# Patient Record
Sex: Female | Born: 1960 | Race: White | Hispanic: No | Smoking: Never smoker
Health system: Southern US, Community
[De-identification: ages and names within clinical notes are randomized; demographics above are authoritative.]

## PROBLEM LIST (undated history)

## (undated) DIAGNOSIS — R51 Headache: Secondary | ICD-10-CM

## (undated) DIAGNOSIS — IMO0002 Reserved for concepts with insufficient information to code with codable children: Secondary | ICD-10-CM

## (undated) DIAGNOSIS — F32A Depression, unspecified: Secondary | ICD-10-CM

## (undated) DIAGNOSIS — Z9889 Other specified postprocedural states: Secondary | ICD-10-CM

## (undated) DIAGNOSIS — R87619 Unspecified abnormal cytological findings in specimens from cervix uteri: Secondary | ICD-10-CM

## (undated) DIAGNOSIS — R112 Nausea with vomiting, unspecified: Secondary | ICD-10-CM

## (undated) DIAGNOSIS — F329 Major depressive disorder, single episode, unspecified: Secondary | ICD-10-CM

## (undated) HISTORY — PX: GYNECOLOGIC CRYOSURGERY: SHX857

## (undated) HISTORY — DX: Reserved for concepts with insufficient information to code with codable children: IMO0002

## (undated) HISTORY — PX: TUBAL LIGATION: SHX77

## (undated) HISTORY — DX: Unspecified abnormal cytological findings in specimens from cervix uteri: R87.619

## (undated) HISTORY — PX: LEEP: SHX91

## (undated) HISTORY — DX: Headache: R51

## (undated) HISTORY — PX: WISDOM TOOTH EXTRACTION: SHX21

---

## 1998-03-06 ENCOUNTER — Ambulatory Visit (HOSPITAL_COMMUNITY): Admission: RE | Admit: 1998-03-06 | Discharge: 1998-03-06 | Payer: Self-pay | Admitting: Family Medicine

## 1999-07-15 ENCOUNTER — Encounter: Admission: RE | Admit: 1999-07-15 | Discharge: 1999-10-13 | Payer: Self-pay | Admitting: Obstetrics & Gynecology

## 2000-04-23 ENCOUNTER — Ambulatory Visit (HOSPITAL_COMMUNITY): Admission: RE | Admit: 2000-04-23 | Discharge: 2000-04-23 | Payer: Self-pay | Admitting: *Deleted

## 2000-04-23 ENCOUNTER — Encounter: Payer: Self-pay | Admitting: *Deleted

## 2000-12-29 ENCOUNTER — Ambulatory Visit (HOSPITAL_BASED_OUTPATIENT_CLINIC_OR_DEPARTMENT_OTHER): Admission: RE | Admit: 2000-12-29 | Discharge: 2000-12-29 | Payer: Self-pay | Admitting: Orthopedic Surgery

## 2001-11-09 HISTORY — PX: CARPAL TUNNEL RELEASE: SHX101

## 2001-12-14 ENCOUNTER — Other Ambulatory Visit: Admission: RE | Admit: 2001-12-14 | Discharge: 2001-12-14 | Payer: Self-pay | Admitting: Obstetrics and Gynecology

## 2001-12-23 ENCOUNTER — Ambulatory Visit (HOSPITAL_COMMUNITY): Admission: RE | Admit: 2001-12-23 | Discharge: 2001-12-23 | Payer: Self-pay | Admitting: Obstetrics and Gynecology

## 2001-12-23 ENCOUNTER — Encounter: Payer: Self-pay | Admitting: Obstetrics and Gynecology

## 2003-01-08 ENCOUNTER — Other Ambulatory Visit: Admission: RE | Admit: 2003-01-08 | Discharge: 2003-01-08 | Payer: Self-pay | Admitting: Obstetrics and Gynecology

## 2003-01-10 ENCOUNTER — Encounter: Payer: Self-pay | Admitting: Obstetrics and Gynecology

## 2003-01-10 ENCOUNTER — Ambulatory Visit (HOSPITAL_COMMUNITY): Admission: RE | Admit: 2003-01-10 | Discharge: 2003-01-10 | Payer: Self-pay | Admitting: Obstetrics and Gynecology

## 2003-04-09 ENCOUNTER — Inpatient Hospital Stay (HOSPITAL_COMMUNITY): Admission: EM | Admit: 2003-04-09 | Discharge: 2003-04-11 | Payer: Self-pay | Admitting: Emergency Medicine

## 2003-04-09 ENCOUNTER — Encounter: Payer: Self-pay | Admitting: Emergency Medicine

## 2003-07-15 ENCOUNTER — Encounter: Payer: Self-pay | Admitting: Emergency Medicine

## 2003-07-15 ENCOUNTER — Emergency Department (HOSPITAL_COMMUNITY): Admission: AD | Admit: 2003-07-15 | Discharge: 2003-07-15 | Payer: Self-pay | Admitting: Emergency Medicine

## 2004-01-30 ENCOUNTER — Other Ambulatory Visit: Admission: RE | Admit: 2004-01-30 | Discharge: 2004-01-30 | Payer: Self-pay | Admitting: Obstetrics and Gynecology

## 2004-04-17 ENCOUNTER — Emergency Department (HOSPITAL_COMMUNITY): Admission: EM | Admit: 2004-04-17 | Discharge: 2004-04-17 | Payer: Self-pay | Admitting: Emergency Medicine

## 2004-09-18 ENCOUNTER — Ambulatory Visit (HOSPITAL_COMMUNITY): Admission: RE | Admit: 2004-09-18 | Discharge: 2004-09-18 | Payer: Self-pay | Admitting: Obstetrics and Gynecology

## 2004-12-01 ENCOUNTER — Ambulatory Visit: Payer: Self-pay | Admitting: Psychology

## 2004-12-08 ENCOUNTER — Ambulatory Visit: Payer: Self-pay | Admitting: Psychology

## 2004-12-15 ENCOUNTER — Ambulatory Visit: Payer: Self-pay | Admitting: Psychology

## 2004-12-31 ENCOUNTER — Ambulatory Visit: Payer: Self-pay | Admitting: Psychology

## 2005-03-16 ENCOUNTER — Ambulatory Visit: Payer: Self-pay | Admitting: Psychology

## 2005-03-24 ENCOUNTER — Ambulatory Visit: Payer: Self-pay | Admitting: Psychology

## 2005-04-30 ENCOUNTER — Ambulatory Visit: Payer: Self-pay | Admitting: Psychology

## 2005-05-26 ENCOUNTER — Ambulatory Visit: Payer: Self-pay | Admitting: Psychology

## 2005-07-08 ENCOUNTER — Ambulatory Visit: Payer: Self-pay | Admitting: Psychology

## 2005-07-17 ENCOUNTER — Ambulatory Visit: Payer: Self-pay | Admitting: Psychology

## 2005-08-28 ENCOUNTER — Ambulatory Visit: Payer: Self-pay | Admitting: Psychology

## 2006-03-05 ENCOUNTER — Ambulatory Visit (HOSPITAL_COMMUNITY): Payer: Self-pay | Admitting: Psychiatry

## 2006-03-24 ENCOUNTER — Ambulatory Visit (HOSPITAL_COMMUNITY): Payer: Self-pay | Admitting: Psychiatry

## 2006-05-19 ENCOUNTER — Ambulatory Visit (HOSPITAL_COMMUNITY): Payer: Self-pay | Admitting: Psychiatry

## 2006-06-09 ENCOUNTER — Other Ambulatory Visit: Admission: RE | Admit: 2006-06-09 | Discharge: 2006-06-09 | Payer: Self-pay | Admitting: Obstetrics and Gynecology

## 2006-06-11 ENCOUNTER — Ambulatory Visit (HOSPITAL_COMMUNITY): Admission: RE | Admit: 2006-06-11 | Discharge: 2006-06-11 | Payer: Self-pay | Admitting: Obstetrics and Gynecology

## 2006-07-19 ENCOUNTER — Ambulatory Visit (HOSPITAL_COMMUNITY): Payer: Self-pay | Admitting: Psychiatry

## 2006-10-18 ENCOUNTER — Ambulatory Visit (HOSPITAL_COMMUNITY): Payer: Self-pay | Admitting: Psychiatry

## 2007-01-17 ENCOUNTER — Ambulatory Visit (HOSPITAL_COMMUNITY): Payer: Self-pay | Admitting: Psychiatry

## 2007-02-08 ENCOUNTER — Encounter: Admission: RE | Admit: 2007-02-08 | Discharge: 2007-02-08 | Payer: Self-pay | Admitting: Obstetrics and Gynecology

## 2007-03-25 ENCOUNTER — Ambulatory Visit (HOSPITAL_COMMUNITY): Payer: Self-pay | Admitting: Psychiatry

## 2007-05-04 ENCOUNTER — Ambulatory Visit (HOSPITAL_COMMUNITY): Payer: Self-pay | Admitting: Psychiatry

## 2007-07-04 ENCOUNTER — Ambulatory Visit (HOSPITAL_COMMUNITY): Payer: Self-pay | Admitting: Psychiatry

## 2007-07-27 ENCOUNTER — Ambulatory Visit (HOSPITAL_COMMUNITY): Admission: RE | Admit: 2007-07-27 | Discharge: 2007-07-27 | Payer: Self-pay | Admitting: Obstetrics and Gynecology

## 2007-08-10 ENCOUNTER — Ambulatory Visit (HOSPITAL_COMMUNITY): Admission: RE | Admit: 2007-08-10 | Discharge: 2007-08-10 | Payer: Self-pay | Admitting: Obstetrics and Gynecology

## 2007-09-15 ENCOUNTER — Emergency Department (HOSPITAL_COMMUNITY): Admission: EM | Admit: 2007-09-15 | Discharge: 2007-09-15 | Payer: Self-pay | Admitting: Emergency Medicine

## 2007-09-28 ENCOUNTER — Ambulatory Visit (HOSPITAL_COMMUNITY): Payer: Self-pay | Admitting: Psychiatry

## 2007-10-26 ENCOUNTER — Ambulatory Visit (HOSPITAL_COMMUNITY): Payer: Self-pay | Admitting: Psychiatry

## 2007-12-07 ENCOUNTER — Ambulatory Visit (HOSPITAL_COMMUNITY): Payer: Self-pay | Admitting: Psychiatry

## 2008-01-25 ENCOUNTER — Ambulatory Visit (HOSPITAL_COMMUNITY): Payer: Self-pay | Admitting: Psychiatry

## 2008-04-30 IMAGING — US US ABDOMEN COMPLETE
1 series · 14 of 25 positions shown · non-contrast
Comparison: none

CLINICAL DATA: Right upper quadrant pain.  
 ABDOMEN ULTRASOUND:
TECHNIQUE: Complete abdominal ultrasound examination was performed including evaluation of the liver, gallbladder, bile ducts, pancreas, kidneys, spleen, IVC, and abdominal aorta.

[Series 1: us abdomen complete · 0.28mm/px · 14 of 72 slices shown]
[im 1/72]
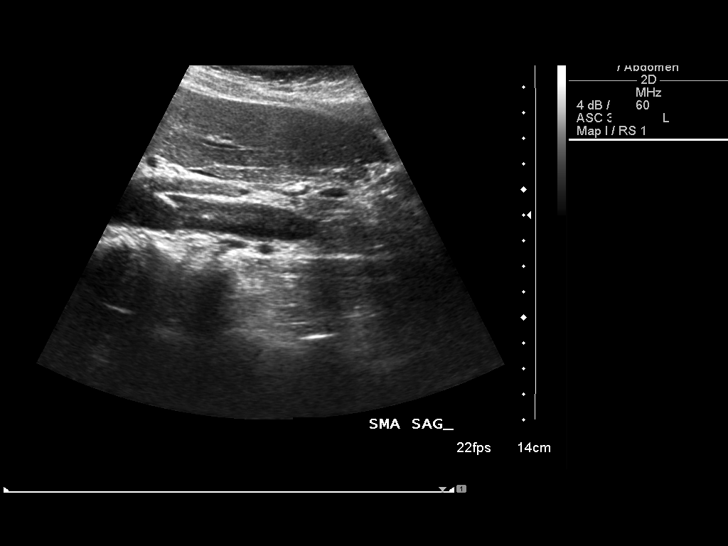
[im 6/72]
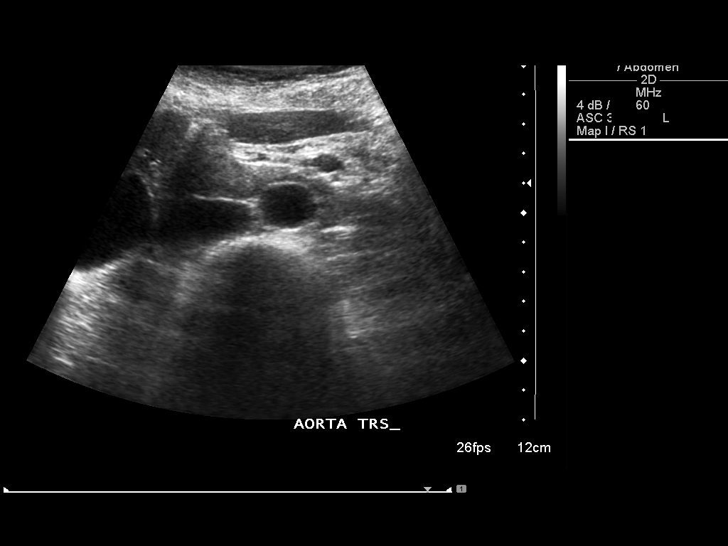
[im 12/72]
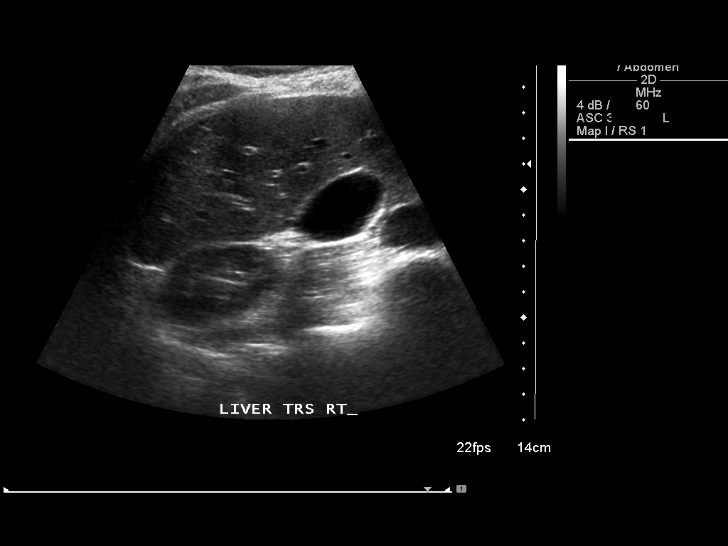
[im 18/72]
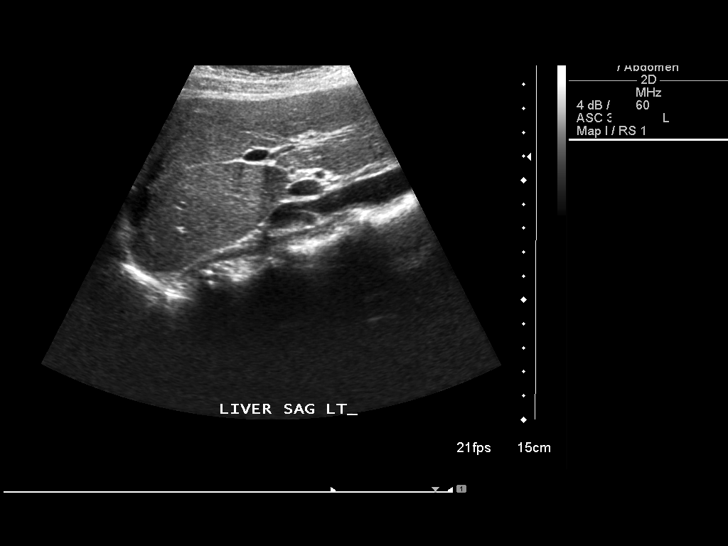
[im 24/72]
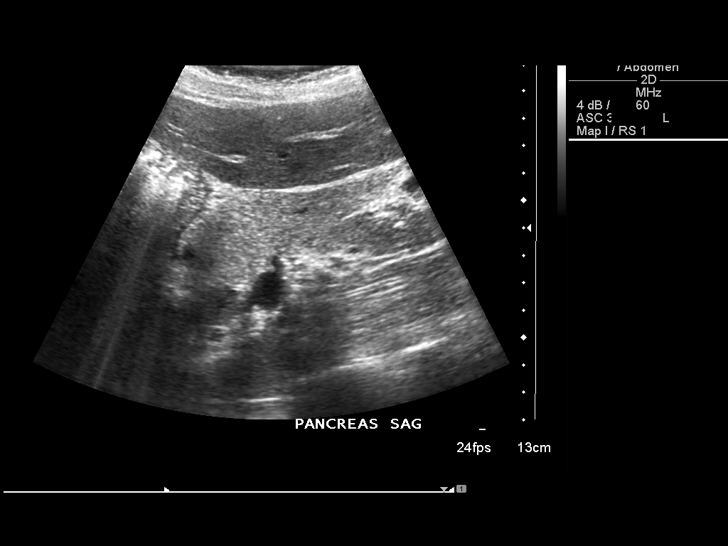
[im 27/72]
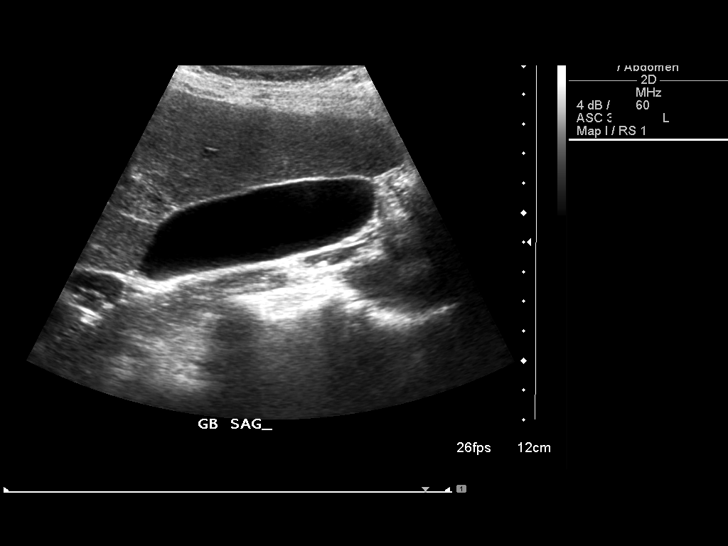
[im 33/72]
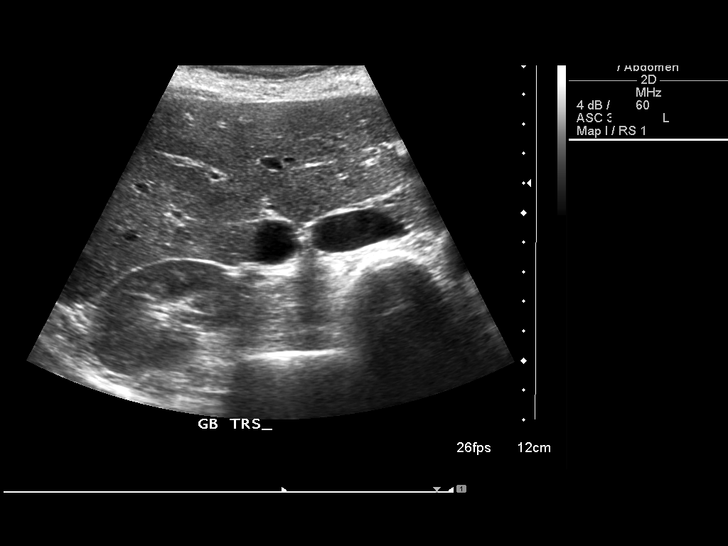
[im 39/72]
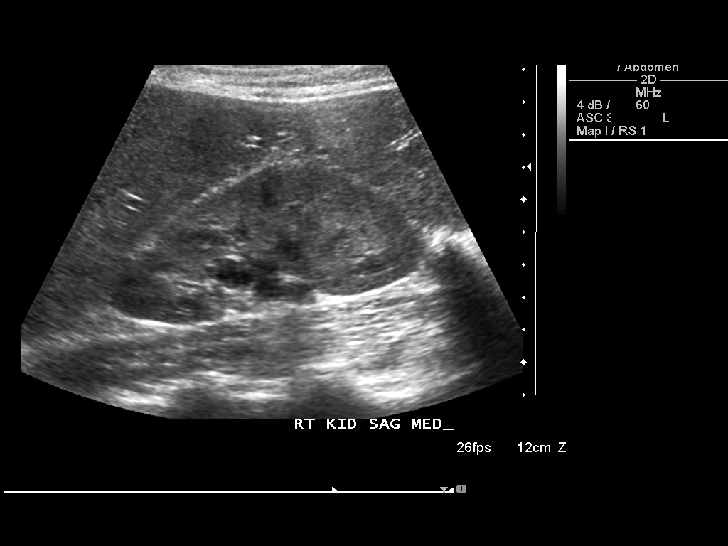
[im 45/72]
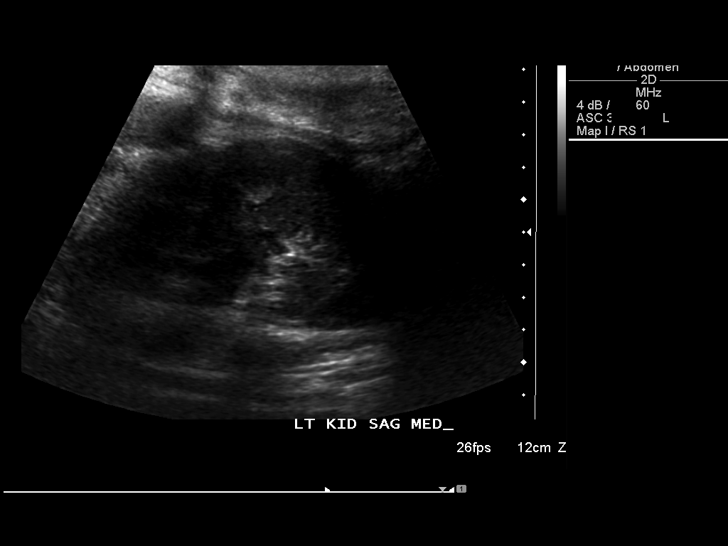
[im 48/72]
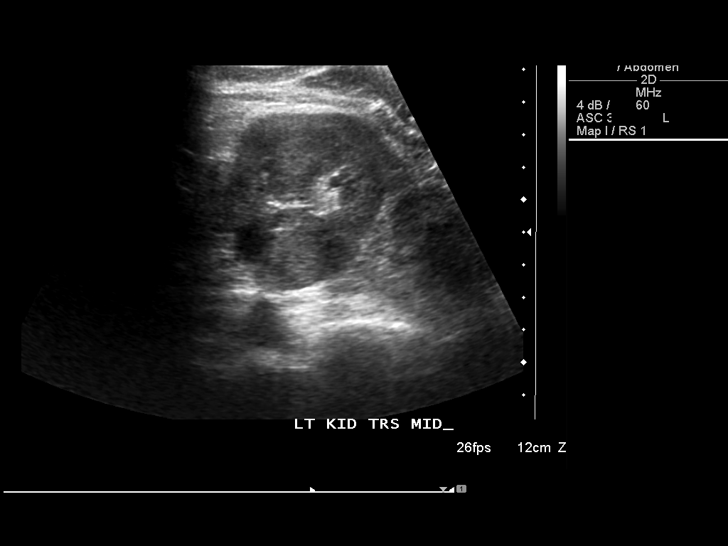
[im 54/72]
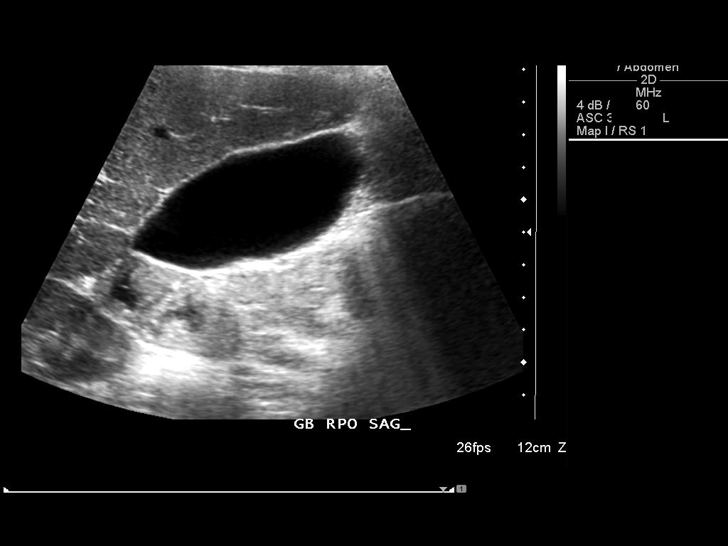
[im 60/72]
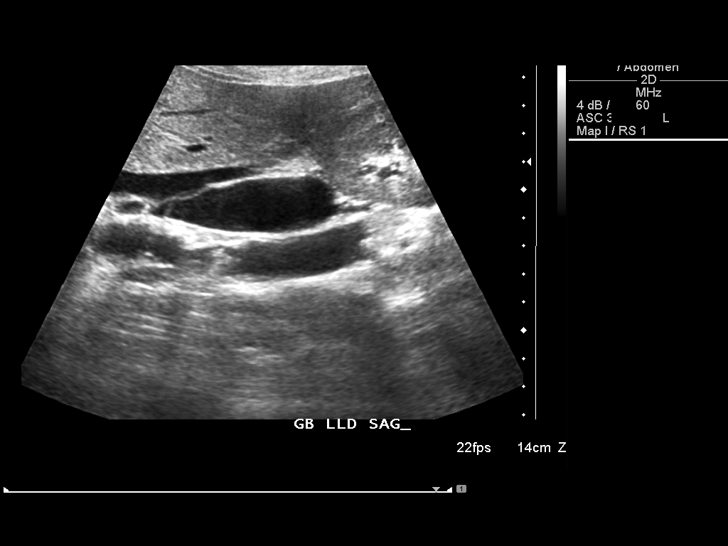
[im 66/72]
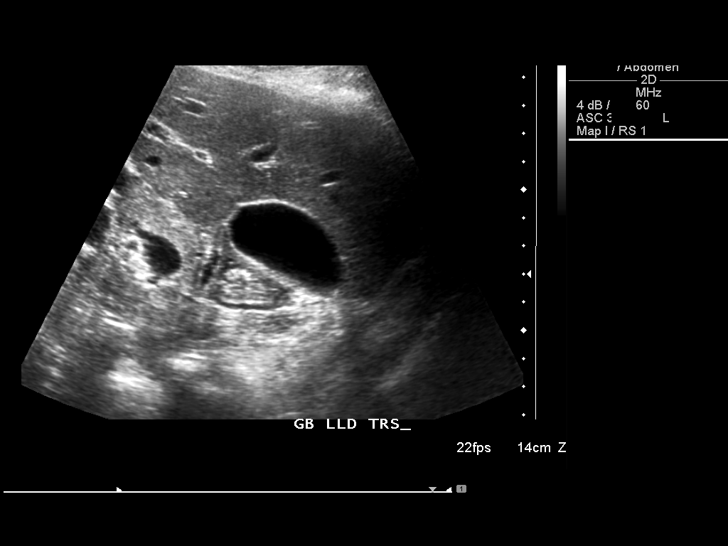
[im 72/72]
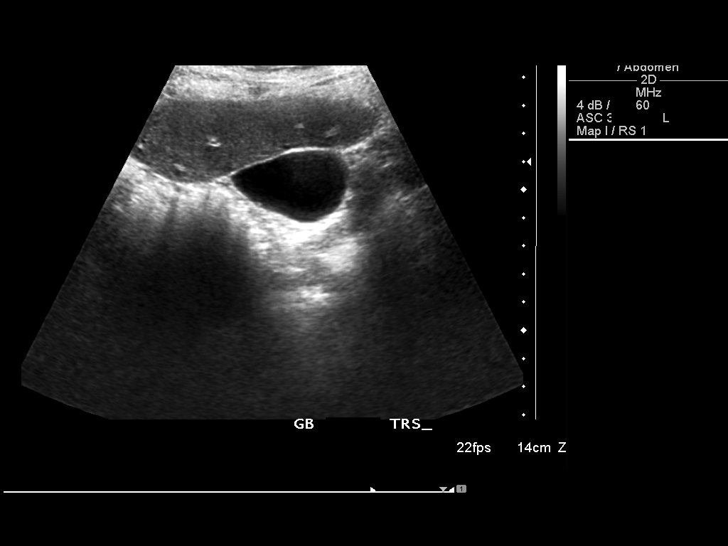

[14 of 25 positions shown; findings below may reference images not displayed]

FINDINGS: The gallbladder is well visualized and appears normal.  No dilated bile ducts.  The common bile duct is 2.2 mm in diameter.  The liver parenchyma, pancreas, inferior vena cava, spleen, kidneys and abdominal aorta are normal.  The right kidney is 11.3 cm in length, and the left kidney is 11.1 cm in length.  Maximum diameter of the abdominal aorta is 1.5 cm.  Spleen is 8.3 cm in maximum length.
IMPRESSION: Normal abdominal ultrasound.  Specifically, the gallbladder is normal.

## 2008-06-06 ENCOUNTER — Ambulatory Visit (HOSPITAL_COMMUNITY): Payer: Self-pay | Admitting: Psychiatry

## 2008-08-28 ENCOUNTER — Encounter: Admission: RE | Admit: 2008-08-28 | Discharge: 2008-08-28 | Payer: Self-pay | Admitting: Internal Medicine

## 2008-11-21 ENCOUNTER — Ambulatory Visit (HOSPITAL_COMMUNITY): Payer: Self-pay | Admitting: Psychiatry

## 2009-03-13 ENCOUNTER — Ambulatory Visit (HOSPITAL_COMMUNITY): Payer: Self-pay | Admitting: Psychiatry

## 2009-07-03 ENCOUNTER — Ambulatory Visit (HOSPITAL_COMMUNITY): Payer: Self-pay | Admitting: Psychiatry

## 2009-08-30 ENCOUNTER — Ambulatory Visit (HOSPITAL_COMMUNITY): Admission: RE | Admit: 2009-08-30 | Discharge: 2009-08-30 | Payer: Self-pay | Admitting: Internal Medicine

## 2010-01-06 ENCOUNTER — Ambulatory Visit (HOSPITAL_COMMUNITY): Payer: Self-pay | Admitting: Psychiatry

## 2010-05-05 ENCOUNTER — Ambulatory Visit (HOSPITAL_COMMUNITY): Payer: Self-pay | Admitting: Psychiatry

## 2010-06-23 ENCOUNTER — Ambulatory Visit (HOSPITAL_COMMUNITY): Admission: RE | Admit: 2010-06-23 | Discharge: 2010-06-23 | Payer: Self-pay | Admitting: Obstetrics and Gynecology

## 2010-06-23 ENCOUNTER — Ambulatory Visit (HOSPITAL_COMMUNITY): Payer: Self-pay | Admitting: Licensed Clinical Social Worker

## 2010-07-16 ENCOUNTER — Ambulatory Visit (HOSPITAL_COMMUNITY): Payer: Self-pay | Admitting: Psychiatry

## 2010-08-15 ENCOUNTER — Ambulatory Visit (HOSPITAL_COMMUNITY): Payer: Self-pay | Admitting: Licensed Clinical Social Worker

## 2010-08-27 ENCOUNTER — Ambulatory Visit (HOSPITAL_COMMUNITY): Payer: Self-pay | Admitting: Psychiatry

## 2010-09-15 ENCOUNTER — Ambulatory Visit (HOSPITAL_COMMUNITY): Payer: Self-pay | Admitting: Licensed Clinical Social Worker

## 2010-10-20 ENCOUNTER — Ambulatory Visit (HOSPITAL_COMMUNITY): Payer: Self-pay | Admitting: Psychiatry

## 2010-10-27 ENCOUNTER — Ambulatory Visit (HOSPITAL_COMMUNITY): Payer: Self-pay | Admitting: Licensed Clinical Social Worker

## 2010-11-19 ENCOUNTER — Ambulatory Visit (HOSPITAL_COMMUNITY)
Admission: RE | Admit: 2010-11-19 | Discharge: 2010-11-19 | Payer: Self-pay | Source: Home / Self Care | Attending: Psychiatry | Admitting: Psychiatry

## 2010-11-30 ENCOUNTER — Encounter: Payer: Self-pay | Admitting: Internal Medicine

## 2010-12-17 ENCOUNTER — Encounter (HOSPITAL_COMMUNITY): Payer: BC Managed Care – PPO | Admitting: Psychiatry

## 2010-12-17 ENCOUNTER — Encounter (HOSPITAL_COMMUNITY): Payer: Self-pay

## 2010-12-17 DIAGNOSIS — F39 Unspecified mood [affective] disorder: Secondary | ICD-10-CM

## 2011-01-14 ENCOUNTER — Encounter (HOSPITAL_COMMUNITY): Payer: BC Managed Care – PPO | Admitting: Psychiatry

## 2011-01-14 DIAGNOSIS — F39 Unspecified mood [affective] disorder: Secondary | ICD-10-CM

## 2011-02-25 ENCOUNTER — Encounter (HOSPITAL_COMMUNITY): Payer: BC Managed Care – PPO | Admitting: Psychiatry

## 2011-02-25 DIAGNOSIS — F39 Unspecified mood [affective] disorder: Secondary | ICD-10-CM

## 2011-03-27 NOTE — Op Note (Signed)
. [facility]  Patient:    Courtney Lee, Courtney Lee                    MRN: [id number] Proc. Date: 12/29/00 Adm. Date:  [id number] Attending:  Alinda Deem                           Operative Report  PREOPERATIVE DIAGNOSIS:  Right carpal tunnel syndrome.  POSTOPERATIVE DIAGNOSIS:  Right carpal tunnel syndrome.  PROCEDURE:  Right carpal tunnel release.  SURGEON:  Alinda Deem, M.D.  FIRST ASSISTANT:  Dorthula Matas, P.A.-C.  ANESTHESIA:  IV regional.  ESTIMATED BLOOD LOSS:  Minimal.  FLUID REPLACEMENT:  500 cc crystalloid.  TOURNIQUET TIME:  25 minutes.  INDICATIONS FOR PROCEDURE:  A 50 year old woman with classic right carpal tunnel syndrome, who desires elective release, having failed conservative treatment with anti-inflammatory medicines, splinting, and I believe she only got temporary relief from a cortisone injection.  Pain wakes her up five of seven nights per week, and she has put up with this for many months and now desires carpal tunnel release.  DESCRIPTION OF PROCEDURE:  Patient identified by arm band, taken to the operating room at [facility], appropriate anesthetic monitors were attached, and IV regional anesthesia was induced into the right upper extremity.  The right upper extremity was then prepped and draped in the usual sterile fashion from the fingertips to the midforearm tourniquet.  I began the procedure by making a volar longitudinal midline incision over the carpal tunnel and just to the ulnar side of the thenar crease.  It was about 4 cm in length.  Small bleeders in the skin and subcutaneous tissue identified and cauterized with bipolar.  We then cut down on the palmar fascia distally and identified the carpal tunnel.  Freer elevator was then passed into the tunnel, keeping it volar up against the ligament, and we then cut down on the [employer] with a 15 blade, performing the carpal  tunnel release.  This release was then extended up into the forearm using the tenotomy scissors that were blunt to further decompress the nerve.  The nerve was identified intact with some blanching, consistent with the carpal tunnel.  The carpal tunnel was then explored with a small finger, and no ganglia or masses were encountered.  The wound was washed out with normal saline solution, then the skin only was closed with running 5-0 nylon suture.  A dressing of Xeroform, 4 x 8 dressing sponges, Webril, and Ace wrap and a _____ splint was applied.  The patient was then taken to the recovery room without difficulty after first letting the tourniquet down. DD:  12/29/00 TD:  12/29/00 Job: [id number]

## 2011-04-22 ENCOUNTER — Encounter (HOSPITAL_COMMUNITY): Payer: BC Managed Care – PPO | Admitting: Psychiatry

## 2011-04-22 DIAGNOSIS — F39 Unspecified mood [affective] disorder: Secondary | ICD-10-CM

## 2011-06-11 ENCOUNTER — Other Ambulatory Visit (HOSPITAL_COMMUNITY): Payer: Self-pay | Admitting: Obstetrics and Gynecology

## 2011-06-11 DIAGNOSIS — Z1231 Encounter for screening mammogram for malignant neoplasm of breast: Secondary | ICD-10-CM

## 2011-06-22 ENCOUNTER — Encounter (HOSPITAL_COMMUNITY): Payer: BC Managed Care – PPO | Admitting: Psychiatry

## 2011-06-29 ENCOUNTER — Ambulatory Visit (HOSPITAL_COMMUNITY)
Admission: RE | Admit: 2011-06-29 | Discharge: 2011-06-29 | Disposition: A | Discharge status: Home / Self Care | Payer: BC Managed Care – PPO | Source: Ambulatory Visit | Attending: Obstetrics and Gynecology | Admitting: Obstetrics and Gynecology

## 2011-06-29 ENCOUNTER — Encounter (HOSPITAL_COMMUNITY): Payer: BC Managed Care – PPO | Admitting: Psychiatry

## 2011-06-29 DIAGNOSIS — F3189 Other bipolar disorder: Secondary | ICD-10-CM

## 2011-06-29 DIAGNOSIS — Z1231 Encounter for screening mammogram for malignant neoplasm of breast: Secondary | ICD-10-CM

## 2011-08-31 ENCOUNTER — Encounter (HOSPITAL_COMMUNITY): Payer: BC Managed Care – PPO | Admitting: Psychiatry

## 2011-10-07 ENCOUNTER — Encounter (HOSPITAL_COMMUNITY): Payer: Self-pay | Admitting: Psychiatry

## 2011-10-07 ENCOUNTER — Ambulatory Visit (HOSPITAL_COMMUNITY): Payer: BC Managed Care – PPO | Admitting: Psychiatry

## 2011-10-07 VITALS — Wt 166.2 lb

## 2011-10-07 DIAGNOSIS — F39 Unspecified mood [affective] disorder: Secondary | ICD-10-CM

## 2011-10-07 MED ORDER — DEXTROAMPHETAMINE SULFATE ER 15 MG PO CP24: ORAL_CAPSULE | ORAL | Status: DC

## 2011-10-07 NOTE — Progress Notes (Signed)
She came for her followup appointment. She is taking now Dexedrine 45 mg in the morning. She continued to have family issues. Her son has been dropped out from school and drinking alcohol. She is very concerned about her family. Patient also have a roommate liked relationship with her husband. I have discussed to start antidepressant however patient does not want to take any more medication. She likes to continue her Lamictal and Dexedrine and she has been tolerating very well and reported no side effects. She denies any alcohol or drug use. She is sleeping better. She described her mood has been stable with occasional sad moments. She denies any agitation anger or crying spells. She is not seeing therapist at this time.  Mental status emanation Patient is casually dressed with good eye contact. Her speech is slow. She described her mood is anxious and her affect is constricted. She denies any active or passive suicidal thinking or homicidal thinking. There no psychotic symptoms present. She's alert and oriented x3. Her insight judgment and impulse control is okay.  Assessment Mood disorder NOS, attention deficit disorder  Plan I will continue Dexedrine 45 mg in the morning and Lamictal 150 mg 2 a daily. I also encouraged to consider Wellbutrin to help her residual depression but patient will consider when she will do research on this medication. In the past she had tried Effexor and Prozac with limited response. I have explained risks and benefits of medication in detail I also recommended to call if she make a decision to start antidepressant. I asked recommended to call if she start feeling worsening of the depression or any time having suicidal thinking and homicidal thinking. I will see her again in 3 months

## 2011-12-09 ENCOUNTER — Encounter (HOSPITAL_COMMUNITY): Payer: Self-pay | Admitting: Psychiatry

## 2011-12-09 ENCOUNTER — Ambulatory Visit (HOSPITAL_COMMUNITY): Payer: BC Managed Care – PPO | Admitting: Psychiatry

## 2011-12-09 VITALS — Wt 164.0 lb

## 2011-12-09 DIAGNOSIS — F988 Other specified behavioral and emotional disorders with onset usually occurring in childhood and adolescence: Secondary | ICD-10-CM

## 2011-12-09 MED ORDER — DEXTROAMPHETAMINE SULFATE ER 15 MG PO CP24: ORAL_CAPSULE | ORAL | Status: DC

## 2011-12-09 MED ORDER — LAMOTRIGINE 150 MG PO TABS: 150.0000 mg | ORAL_TABLET | Freq: Two times a day (BID) | ORAL | Status: DC

## 2011-12-09 NOTE — Progress Notes (Signed)
She came for her followup appointment. She was feeling depressed and sad around Christmastime however she was able to come out from that depression and now she is feeling much better. Her daughter got a job and she is very happy about it. She is compliant with her Dexedrine 45 mg in the morning. Though she continued to have family issues but she is handling better recently. She denies any agitation anger or mood swings. She is sleeping better since she is paying a lot of attention on sleep hygiene. She has recently seen her physician and her blood work shows within normal limits. Her weight has been stable. She described her mood has been stable with occasional sad moments. She denies any agitation anger or crying spells.   Mental status emanation Patient is casually dressed with good eye contact. Her speech is slow. She described her mood is anxious and her affect is constricted. She denies any active or passive suicidal thinking or homicidal thinking. There no psychotic symptoms present. She's alert and oriented x3. Her insight judgment and impulse control is okay.  Assessment Mood disorder NOS, attention deficit disorder  Plan I will continue Dexedrine 45 mg in the morning and Lamictal 150 mg 2 a daily. She does not want to add any other medication. She denies any side effects including rash or itching with Lamictal. I have explained risks and benefits of medication in detail. I will see her again in 2 months

## 2012-02-11 ENCOUNTER — Other Ambulatory Visit (HOSPITAL_COMMUNITY): Payer: Self-pay

## 2012-02-11 DIAGNOSIS — F988 Other specified behavioral and emotional disorders with onset usually occurring in childhood and adolescence: Secondary | ICD-10-CM

## 2012-02-11 MED ORDER — DEXTROAMPHETAMINE SULFATE ER 15 MG PO CP24: ORAL_CAPSULE | ORAL | Status: DC

## 2012-02-17 ENCOUNTER — Ambulatory Visit (INDEPENDENT_AMBULATORY_CARE_PROVIDER_SITE_OTHER): Payer: BC Managed Care – PPO | Admitting: Psychiatry

## 2012-02-17 ENCOUNTER — Encounter (HOSPITAL_COMMUNITY): Payer: Self-pay | Admitting: Psychiatry

## 2012-02-17 DIAGNOSIS — F988 Other specified behavioral and emotional disorders with onset usually occurring in childhood and adolescence: Secondary | ICD-10-CM

## 2012-02-17 MED ORDER — LAMOTRIGINE 150 MG PO TABS: 150.0000 mg | ORAL_TABLET | Freq: Two times a day (BID) | ORAL | Status: DC

## 2012-02-17 MED ORDER — DEXTROAMPHETAMINE SULFATE ER 15 MG PO CP24: ORAL_CAPSULE | ORAL | Status: DC

## 2012-02-17 NOTE — Progress Notes (Signed)
Chief complaint Medication management and followup.  History of presenting illness Patient is 51 year old Caucasian unemployed married female who came for her followup appointment.  She's doing much better on her medication.  She present with good mood as recently she started marriage counseling .  Her husband also joined and she feels their communication is much improved .  She feels relax relief and denied any recent irritable or agitation.  She sleeps fine and reported no side effects of medication.  She likes her current medication .  She's some worried as her 51th birthday is coming .  She is thinking about her age and getting old but she is also happy that her marriage and marital relationship is improved .  She has any agitation anger or mood swings.  She denies any tremors or any side effects.  She's also reading a book about relationship which she believes given insight .  Her sleep and appetite is unchanged.  Current psychiatric medication Dexedrine 45 mg in the morning Lamictal 150 mg twice a day   Medical history Patient has history of asthma and headache.  Mental status examination Patient is casually dressed and fairly groomed.  She is pleasant, cooperative and maintained good eye contact. Her speech is slow but fluent and coherent.. She described her mood is good and her affect is mood congruent.  She denies any active or passive suicidal thinking or homicidal thinking. There no psychotic symptoms present. She's alert and oriented x3. Her insight judgment and impulse control is okay.  Assessment Axis I Mood disorder NOS, attention deficit disorder Axis II deferred Axis III asthma and headache Axis IV mild Axis V 60-65  Plan I will continue Dexedrine 45 mg in the morning and Lamictal 150 mg 2 a daily.  Reinforce marriage counseling which is helping her.  At this time patient does not have any side effects including any rash with Lamictal.  I will see her again in 10 weeks.  I  recommended to call us if she feels worsening of the symptoms.  She's not abusing her stimulant or asking earlier he feels .  She does not drink or use any illegal substance.

## 2012-04-22 ENCOUNTER — Ambulatory Visit (HOSPITAL_COMMUNITY): Payer: BC Managed Care – PPO | Admitting: Psychiatry

## 2012-04-29 ENCOUNTER — Ambulatory Visit (HOSPITAL_COMMUNITY): Payer: BC Managed Care – PPO | Admitting: Psychiatry

## 2012-05-18 ENCOUNTER — Ambulatory Visit (INDEPENDENT_AMBULATORY_CARE_PROVIDER_SITE_OTHER): Payer: BC Managed Care – PPO | Admitting: Psychiatry

## 2012-05-18 ENCOUNTER — Encounter (HOSPITAL_COMMUNITY): Payer: Self-pay | Admitting: Psychiatry

## 2012-05-18 DIAGNOSIS — F39 Unspecified mood [affective] disorder: Secondary | ICD-10-CM

## 2012-05-18 DIAGNOSIS — F988 Other specified behavioral and emotional disorders with onset usually occurring in childhood and adolescence: Secondary | ICD-10-CM

## 2012-05-18 MED ORDER — DEXTROAMPHETAMINE SULFATE ER 15 MG PO CP24: ORAL_CAPSULE | ORAL | Status: DC

## 2012-05-18 MED ORDER — LAMOTRIGINE 150 MG PO TABS: 150.0000 mg | ORAL_TABLET | Freq: Two times a day (BID) | ORAL | Status: DC
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
## 2012-08-17 ENCOUNTER — Ambulatory Visit (HOSPITAL_COMMUNITY): Payer: BC Managed Care – PPO | Admitting: Psychiatry

## 2012-08-26 ENCOUNTER — Encounter (HOSPITAL_COMMUNITY): Payer: Self-pay | Admitting: Psychiatry

## 2012-08-26 ENCOUNTER — Ambulatory Visit (INDEPENDENT_AMBULATORY_CARE_PROVIDER_SITE_OTHER): Payer: BC Managed Care – PPO | Admitting: Psychiatry

## 2012-08-26 DIAGNOSIS — F988 Other specified behavioral and emotional disorders with onset usually occurring in childhood and adolescence: Secondary | ICD-10-CM

## 2012-08-26 DIAGNOSIS — F39 Unspecified mood [affective] disorder: Secondary | ICD-10-CM

## 2012-08-26 MED ORDER — DEXTROAMPHETAMINE SULFATE ER 15 MG PO CP24: ORAL_CAPSULE | ORAL | Status: DC

## 2012-08-26 MED ORDER — LAMOTRIGINE 150 MG PO TABS: 150.0000 mg | ORAL_TABLET | Freq: Two times a day (BID) | ORAL | Status: DC
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
## 2012-09-07 ENCOUNTER — Other Ambulatory Visit: Payer: Self-pay | Admitting: Obstetrics and Gynecology

## 2012-09-07 ENCOUNTER — Encounter: Payer: Self-pay | Admitting: Obstetrics and Gynecology

## 2012-09-07 ENCOUNTER — Ambulatory Visit (INDEPENDENT_AMBULATORY_CARE_PROVIDER_SITE_OTHER): Payer: BC Managed Care – PPO | Admitting: Obstetrics and Gynecology

## 2012-09-07 VITALS — BP 118/76 | Ht 63.5 in | Wt 158.8 lb

## 2012-09-07 DIAGNOSIS — Z124 Encounter for screening for malignant neoplasm of cervix: Secondary | ICD-10-CM

## 2012-09-07 DIAGNOSIS — Z202 Contact with and (suspected) exposure to infections with a predominantly sexual mode of transmission: Secondary | ICD-10-CM

## 2012-09-07 DIAGNOSIS — Z2089 Contact with and (suspected) exposure to other communicable diseases: Secondary | ICD-10-CM

## 2012-09-07 LAB — HIV ANTIBODY (ROUTINE TESTING W REFLEX): HIV: NONREACTIVE

## 2012-09-07 LAB — RPR

## 2012-09-07 NOTE — Progress Notes (Signed)
Subjective:  AEX  Courtney Lee is a 51 y.o. female, G3P3, who presents for an annual exam.   Last Pap: 06/11/11 WNL: Yes Regular Periods:yes Contraception: BTL  Monthly Breast exam:no Tetanus<44yrs:yes Nl.Bladder Function:yes Daily BMs:yes Healthy Diet:yes Calcium:no Mammogram:yes Date of Mammogram: 06/29/11 Exercise:yes Have often Exercise: 6 days weekly Seatbelt: yes Abuse at home: no Stressful work:no Sigmoid-colonoscopy: not yet Bone Density: No PCP: DR. Earl Gala Change in PMH: no change Change in FMH:no change BMI=28  History   Social History  . Marital Status: Married    Spouse Name: N/A    Number of Children: N/A  . Years of Education: N/A   Social History Main Topics  . Smoking status: Never Smoker   . Smokeless tobacco: Never Used  . Alcohol Use: No  . Drug Use: No  . Sexually Active: None   Other Topics Concern  . None   Social History Narrative  . None    Menstrual cycle:   LMP: Patient's last menstrual period was 09/02/2012.           Cycle: regular monthly.  No IM bleeding  The following portions of the patient's history were reviewed and updated as appropriate: allergies, current medications, past family history, past medical history, past social history, past surgical history and problem list.  Pt questioned the validity of HSV done last year since, she had by her hx, HSV testing during her pregnancies that was negative. She denies any outbreaks. She declined repeat testing.  Review of Systems Pertinent items are noted in HPI. Breast:Negative for breast lump,nipple discharge or nipple retraction Gastrointestinal: Negative for abdominal pain, change in bowel habits or rectal bleeding Urinary:negative   Objective:    Ht 5' 3.5" (1.613 m)  Wt 158 lb 12.8 oz (72.031 kg)  BMI 27.69 kg/m2  LMP 09/02/2012    Weight:  Wt Readings from Last 1 Encounters:  09/07/12 158 lb 12.8 oz (72.031 kg)          BMI: Body mass index is 27.69  kg/(m^2).  General Appearance: Alert, appropriate appearance for age. No acute distress HEENT: Grossly normal Neck / Thyroid: Supple, no masses, nodes or enlargement Lungs: clear to auscultation bilaterally Back: No CVA tenderness Breast Exam: No masses or nodes.No dimpling, nipple retraction or discharge. Cardiovascular: Regular rate and rhythm. S1, S2, no murmur Gastrointestinal: Soft, non-tender, no masses or organomegaly Pelvic Exam: Vulva and vagina appear normal. Bimanual exam reveals normal uterus and adnexa. Rectovaginal: normal rectal, no masses Lymphatic Exam: Non-palpable nodes in neck, clavicular, axillary, or inguinal regions Skin: no rash or abnormalities Neurologic: Normal gait and speech, no tremor  Psychiatric: Alert and oriented, appropriate affect.   Wet Prep:not applicable Urinalysis:not applicable UPT: Not done   Assessment:    Normal gyn exam  Hx laser therapy for abnl pap 1991 with nl paps since then   Plan:    mammogram pap smear with HR HPV.  If nl will begin routine screening. counseled on breast self exam, STD prevention and HIV risk factors and prevention as well as CDC guidelines for annual HIV and RPR. return annually or prn STD screening: done, as above Contraception:bilateral tubal ligation    Dierdre Forth MD

## 2012-09-09 LAB — PAP IG AND HPV HIGH-RISK: HPV DNA High Risk: DETECTED — AB

## 2012-11-28 ENCOUNTER — Ambulatory Visit (HOSPITAL_COMMUNITY): Payer: BC Managed Care – PPO | Admitting: Psychiatry

## 2012-12-01 ENCOUNTER — Encounter (HOSPITAL_COMMUNITY): Payer: Self-pay | Admitting: Psychiatry

## 2012-12-01 ENCOUNTER — Ambulatory Visit (INDEPENDENT_AMBULATORY_CARE_PROVIDER_SITE_OTHER): Payer: BC Managed Care – PPO | Admitting: Psychiatry

## 2012-12-01 VITALS — BP 134/89 | HR 80 | Wt 164.8 lb

## 2012-12-01 DIAGNOSIS — F988 Other specified behavioral and emotional disorders with onset usually occurring in childhood and adolescence: Secondary | ICD-10-CM

## 2012-12-01 DIAGNOSIS — F39 Unspecified mood [affective] disorder: Secondary | ICD-10-CM

## 2012-12-01 MED ORDER — DEXTROAMPHETAMINE SULFATE ER 15 MG PO CP24: ORAL_CAPSULE | ORAL | Status: DC

## 2012-12-01 MED ORDER — LAMOTRIGINE 150 MG PO TABS: 150.0000 mg | ORAL_TABLET | Freq: Two times a day (BID) | ORAL | Status: DC
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
## 2013-01-04 ENCOUNTER — Other Ambulatory Visit (HOSPITAL_COMMUNITY): Payer: Self-pay | Admitting: Internal Medicine

## 2013-01-04 ENCOUNTER — Other Ambulatory Visit: Payer: Self-pay | Admitting: Internal Medicine

## 2013-01-04 ENCOUNTER — Ambulatory Visit (HOSPITAL_COMMUNITY)
Admission: RE | Admit: 2013-01-04 | Discharge: 2013-01-04 | Disposition: A | Discharge status: Home / Self Care | Payer: BC Managed Care – HMO | Source: Ambulatory Visit | Attending: Internal Medicine | Admitting: Internal Medicine

## 2013-01-04 DIAGNOSIS — Z1231 Encounter for screening mammogram for malignant neoplasm of breast: Secondary | ICD-10-CM | POA: Insufficient documentation

## 2013-01-23 ENCOUNTER — Telehealth: Payer: Self-pay | Admitting: Oncology

## 2013-01-23 ENCOUNTER — Telehealth (HOSPITAL_COMMUNITY): Payer: Self-pay | Admitting: Psychiatry

## 2013-01-23 NOTE — Telephone Encounter (Signed)
S/W PT IN RE NP APPT 03/18 @ 1:30 W/DR. SHADAD REFERRING DR. 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

## 2013-01-23 NOTE — Telephone Encounter (Signed)
C/D 01/23/13 for appt. 3/418/14

## 2013-01-23 NOTE — Telephone Encounter (Signed)
Labs received Glucose 84, AST:ALT 34:22 entirely WNL from [facility].

## 2013-01-24 ENCOUNTER — Ambulatory Visit (HOSPITAL_COMMUNITY)
Admission: RE | Admit: 2013-01-24 | Discharge: 2013-01-24 | Disposition: A | Discharge status: Home / Self Care | Payer: BC Managed Care – PPO | Source: Ambulatory Visit | Attending: Oncology | Admitting: Oncology

## 2013-01-24 ENCOUNTER — Telehealth: Payer: Self-pay | Admitting: Oncology

## 2013-01-24 ENCOUNTER — Other Ambulatory Visit: Payer: Self-pay | Admitting: Oncology

## 2013-01-24 ENCOUNTER — Ambulatory Visit: Payer: Self-pay

## 2013-01-24 ENCOUNTER — Other Ambulatory Visit (HOSPITAL_BASED_OUTPATIENT_CLINIC_OR_DEPARTMENT_OTHER): Payer: BC Managed Care – PPO | Admitting: Lab

## 2013-01-24 ENCOUNTER — Ambulatory Visit (HOSPITAL_BASED_OUTPATIENT_CLINIC_OR_DEPARTMENT_OTHER): Payer: BC Managed Care – PPO | Admitting: Oncology

## 2013-01-24 VITALS — BP 123/81 | HR 71 | Temp 97.3°F | Resp 20 | Ht 63.5 in | Wt 162.5 lb

## 2013-01-24 DIAGNOSIS — M47817 Spondylosis without myelopathy or radiculopathy, lumbosacral region: Secondary | ICD-10-CM | POA: Insufficient documentation

## 2013-01-24 DIAGNOSIS — I6529 Occlusion and stenosis of unspecified carotid artery: Secondary | ICD-10-CM | POA: Insufficient documentation

## 2013-01-24 DIAGNOSIS — Q248 Other specified congenital malformations of heart: Secondary | ICD-10-CM | POA: Insufficient documentation

## 2013-01-24 DIAGNOSIS — J45909 Unspecified asthma, uncomplicated: Secondary | ICD-10-CM

## 2013-01-24 DIAGNOSIS — D729 Disorder of white blood cells, unspecified: Secondary | ICD-10-CM

## 2013-01-24 DIAGNOSIS — G43909 Migraine, unspecified, not intractable, without status migrainosus: Secondary | ICD-10-CM

## 2013-01-24 DIAGNOSIS — D472 Monoclonal gammopathy: Secondary | ICD-10-CM

## 2013-01-24 DIAGNOSIS — D7289 Other specified disorders of white blood cells: Secondary | ICD-10-CM | POA: Insufficient documentation

## 2013-01-24 DIAGNOSIS — M47812 Spondylosis without myelopathy or radiculopathy, cervical region: Secondary | ICD-10-CM | POA: Insufficient documentation

## 2013-01-24 LAB — COMPREHENSIVE METABOLIC PANEL (CC13)
ALT: 21 U/L (ref 0–55)
AST: 27 U/L (ref 5–34)
Albumin: 3.6 g/dL (ref 3.5–5.0)
Calcium: 8.9 mg/dL (ref 8.4–10.4)
Chloride: 106 mEq/L (ref 98–107)
Potassium: 3.8 mEq/L (ref 3.5–5.1)
Sodium: 139 mEq/L (ref 136–145)

## 2013-01-24 LAB — CBC WITH DIFFERENTIAL/PLATELET
BASO%: 0.9 % (ref 0.0–2.0)
EOS%: 1.9 % (ref 0.0–7.0)
HGB: 12.3 g/dL (ref 11.6–15.9)
MCH: 28.9 pg (ref 25.1–34.0)
MCHC: 32.6 g/dL (ref 31.5–36.0)
RBC: 4.27 10*6/uL (ref 3.70–5.45)
RDW: 16.5 % — ABNORMAL HIGH (ref 11.2–14.5)
lymph#: 1.4 10*3/uL (ref 0.9–3.3)

## 2013-01-24 NOTE — Progress Notes (Signed)
CC:   Courtney Lee, M.D.  REASON FOR CONSULTATION:  Elevated IgM monoclonal protein.  HISTORY OF PRESENT ILLNESS:  [name] is a 52 year old woman native of [place], lived in multiple locations including [place], [place], currently of [place], [place].  She gets her routine medical care under the care of Dr. Earl Gala and [facility].  She has a past medical history significant for migraine headaches as well as asthma and most recently on 02/26 presented to her medical doctor with complaints of weakness, fatigue and tiredness.  At that time she had laboratory testing including normal electrolytes with a total protein of 6.9 as well as normal albumin.  She had also normal kidney function with a creatinine of 0.81.  She had low ferritin at 5.9 and was started on iron supplementation and had felt really well afterwards.  Also it was noted that she had a normal TSH of 1.96.  She had a serum protein electrophoresis and the interpretation appears to have a single peak in the gamma region that might represent monoclonal protein.  It appears to be around 0.3 g/dL.  Urine immunofixation was negative at that time. Quantitative immunoglobulin revealed that she had an elevated IgM of 460 and the lower limit of normal was 230.  She had normal IgA and IgG. Repeat IgM level was 463 back on 01/12/2013.  For that reason the patient was referred to me for evaluation.  Clinically she feels well. She again reported migraine headaches and insomnia which have been chronic.  She has had some lower back pain that appeared to be musculoskeletal in nature.  She had not reported any bony pain, had not had any pathological fractures, had not had any recurrent sinopulmonary infection or neuropathy.  She continued to perform most activities of daily living without any hindrance or decline.  REVIEW OF SYSTEMS:  Does not report any headaches, blurred vision, double vision.  Does not report any motor or  sensory neuropathy.  Does not report any alteration in mental status.  Does not report any psychiatric issues, depression.  Does not report any fever, chills, sweats.  Does not report any cough, hemoptysis, hematemesis.  No nausea, vomiting.  No abdominal pain, hematochezia, melena, genitourinary complaints.  Rest of review of systems unremarkable.  PAST MEDICAL HISTORY:  Significant for asthma and a history of migraines, ADD and depression.  She had history of 3 C sections.  No history of hypertension, diabetes or heart disease at this time.  MEDICATIONS:  Reviewed today.  She is currently on lamotrigine.  She is on ProAir as needed.  Multivitamin, Singulair, magnesium, Lunesta, Frova, iron supplements, dextromethorphan, Zyrtec and B complex supplements.  SOCIAL HISTORY:  She is married.  She has 2 children.  Denied alcohol or tobacco abuse.  She is currently staying at home.  She is training recreational therapist.  PHYSICAL EXAMINATION:  General:  Alert, awake, pleasant appearing woman. Did not appear in any active distress today.  Vital signs:  Her blood pressure 123/81, pulse 71, respirations 20, weight is 162 pounds.  ECOG performance status is 0.  HEENT:  Head is normocephalic, atraumatic. Pupils equal, round, reactive to light.  Oral mucosa moist and pink. Neck:  Supple.  No lymphadenopathy.  Heart:  Regular rate and rhythm. S1, S2.  Lungs:  Clear to auscultation without rhonchi, wheeze, dullness to percussion.  Abdomen:  Soft, nontender.  No hepatosplenomegaly. Extremities:  No clubbing, cyanosis or edema.  Neurological:  Intact motor, sensory and deep tendon reflexes.  LABORATORY DATA:  So far showed a hemoglobin of 12.3, white cell count 5.4, platelet count of 247.  She had normal liver function tests and electrolytes.  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  Her M spike is around 0.3 g/dL  and her quantitative immunoglobulins of IgM subtype around 463. I had a long discussion today with [name] and her husband discussing the differential diagnosis at this time.  I have talked to them about plasma cell disorders in the form of IgM MGUS (monoclonal gammopathy of undetermined significance) versus multiple myeloma versus amyloidosis.  At this point I do not see really clear-cut evidence to suggest amyloidosis or IgM multiple myeloma which is extremely rare. This also could be related to an IgM MGUS.  Other causes would be a lymphoproliferative disorder that can manifest itself with a monoclonal IgM such as non Hodgkin's lymphoma or other lymphoproliferative disorder.  I also talked to them about other benign etiologies such as reactive autoimmune conditions that can cause elevation in the IgM, conditions such as manifest itself with asthma and migraines which she does and certainly could be a possibility.  However, to work this up completely I will obtain a skeletal survey to rule out multiple myeloma. I will also obtain a CT scan of the abdomen and pelvis as well as the chest to rule out any lymphadenopathy and lymphoma.  If this is all negative I think it would be low yield to do a bone marrow biopsy and I have talked to her about it briefly today.  All of her questions were answered today.  I will have her for quick followup in a couple weeks after these results are completed and then give my final recommendation at that time.  In all likelihood, it will probably be either reactive or IgM MGUS.    ______________________________ Benjiman Core, M.D. FNS/MEDQ  D:  01/24/2013  T:  01/24/2013  Job:  [id number]

## 2013-01-24 NOTE — Telephone Encounter (Signed)
GVE THE PT HER April 2014 APPT CALENDAR ALONG WITH THE CT SCAN APPT FOR THIS WEEK. PT WENT OVER TO RAD TO DO HER SKELETAL SURVEY APPT.

## 2013-01-24 NOTE — Progress Notes (Signed)
Note dictated

## 2013-01-26 LAB — SPEP & IFE WITH QIG
Albumin ELP: 58.6 % (ref 55.8–66.1)
Alpha-1-Globulin: 4.5 % (ref 2.9–4.9)
Alpha-2-Globulin: 9.5 % (ref 7.1–11.8)
Beta 2: 4.5 % (ref 3.2–6.5)
Gamma Globulin: 16.8 % (ref 11.1–18.8)

## 2013-01-26 LAB — KAPPA/LAMBDA LIGHT CHAINS
Kappa free light chain: 1.05 mg/dL (ref 0.33–1.94)
Kappa:Lambda Ratio: 0.58 (ref 0.26–1.65)

## 2013-01-27 ENCOUNTER — Ambulatory Visit (HOSPITAL_COMMUNITY)
Admission: RE | Admit: 2013-01-27 | Discharge: 2013-01-27 | Disposition: A | Discharge status: Home / Self Care | Payer: BC Managed Care – PPO | Source: Ambulatory Visit | Attending: Oncology | Admitting: Oncology

## 2013-01-27 DIAGNOSIS — E8809 Other disorders of plasma-protein metabolism, not elsewhere classified: Secondary | ICD-10-CM | POA: Insufficient documentation

## 2013-01-27 DIAGNOSIS — D7289 Other specified disorders of white blood cells: Secondary | ICD-10-CM | POA: Insufficient documentation

## 2013-01-27 DIAGNOSIS — M47814 Spondylosis without myelopathy or radiculopathy, thoracic region: Secondary | ICD-10-CM | POA: Insufficient documentation

## 2013-01-27 DIAGNOSIS — R911 Solitary pulmonary nodule: Secondary | ICD-10-CM | POA: Insufficient documentation

## 2013-01-27 DIAGNOSIS — D729 Disorder of white blood cells, unspecified: Secondary | ICD-10-CM

## 2013-01-27 MED ORDER — IOHEXOL 300 MG/ML  SOLN
100.0000 mL | Freq: Once | INTRAMUSCULAR | Status: AC | PRN
  Administered 2013-01-27: 100 mL via INTRAVENOUS

## 2013-01-30 ENCOUNTER — Encounter: Payer: Self-pay | Admitting: Oncology

## 2013-02-09 ENCOUNTER — Encounter: Payer: Self-pay | Admitting: Oncology

## 2013-02-14 ENCOUNTER — Ambulatory Visit (HOSPITAL_BASED_OUTPATIENT_CLINIC_OR_DEPARTMENT_OTHER): Payer: BC Managed Care – PPO | Admitting: Oncology

## 2013-02-14 ENCOUNTER — Telehealth: Payer: Self-pay | Admitting: Oncology

## 2013-02-14 VITALS — BP 119/74 | HR 85 | Temp 97.0°F | Resp 18 | Ht 63.0 in | Wt 163.8 lb

## 2013-02-14 DIAGNOSIS — D729 Disorder of white blood cells, unspecified: Secondary | ICD-10-CM

## 2013-02-14 DIAGNOSIS — D7289 Other specified disorders of white blood cells: Secondary | ICD-10-CM

## 2013-02-14 NOTE — Progress Notes (Signed)
Hematology and Oncology Follow Up Visit  Courtney Lee [id number] 09/18/1961 52 y.o. 02/14/2013 3:21 PM Darnelle Bos, MDOsborne, Bethann Humble,*   Principle Diagnosis: 52 year old with IgM monoclonal protein diagnosed in 01/2013. Likely reactive vs MGUS.  Interim History:  [name] presents today for a follow up visit. She is a nice women with the above history. Since her last visit she reports no new symptoms. She continues to report migraine headaches and insomnia which have been chronic. She has had some lower back pain that appeared to be  musculoskeletal in nature. She had not reported any bony pain, had not had any pathological fractures, had not had any recurrent sinopulmonary  infection or neuropathy. She continued to perform most activities of daily living without any hindrance or decline.   Medications: I have reviewed the patient's current medications. Current outpatient prescriptions:B Complex-C (SUPER B COMPLEX PO), Take 1 tablet by mouth daily., Disp: , Rfl: ;  cetirizine (ZYRTEC) 10 MG tablet, Take 10 mg by mouth daily., Disp: , Rfl: ;  [employer] BIOTIN PO, Take 10,000 mg by mouth daily., Disp: , Rfl: ;  dextroamphetamine (DEXEDRINE SPANSULE) 15 MG 24 hr capsule, Take 3 in AM, Disp: 90 capsule, Rfl: 0;  Ferrous Sulfate (IRON) 325 (65 FE) MG TABS, Take 65 mg by mouth daily., Disp: , Rfl:  frovatriptan (FROVA) 2.5 MG tablet, Take 2.5 mg by mouth as needed. If recurs, may repeat after 2 hours. Max of 3 tabs in 24 hours. , Disp: , Rfl: ;  lamoTRIgine (LAMICTAL) 150 MG tablet, Take 1 tablet (150 mg total) by mouth 2 (two) times daily., Disp: 60 tablet, Rfl: 2;  LUNESTA 2 MG TABS, , Disp: , Rfl: ;  Magnesium-Zinc (MAGNESIUM-CHELATED ZINC PO), Take 400 mg by mouth daily., Disp: , Rfl:  montelukast (SINGULAIR) 10 MG tablet, , Disp: , Rfl: ;  Multiple Vitamin (MULTIVITAMIN) tablet, Take 1 tablet by mouth daily., Disp: , Rfl: ;  PROAIR HFA 108 (90 BASE) MCG/ACT inhaler, , Disp: , Rfl:    Allergies:  Allergies  Allergen Reactions  . Codeine   . Erythromycin     Past Medical History, Surgical history, Social history, and Family History were reviewed and updated.  Review of Systems: Constitutional:  Negative for fever, chills, night sweats, anorexia, weight loss, pain. Cardiovascular: no chest pain or dyspnea on exertion Respiratory: negative Neurological: negative Dermatological: negative ENT: negative Skin: Negative. Gastrointestinal: no abdominal pain, change in bowel habits, or black or bloody stools Genito-Urinary: negative Hematological and Lymphatic: negative Breast: negative Musculoskeletal: negative Remaining ROS negative. Physical Exam: Blood pressure 119/74, pulse 85, temperature 97 F (36.1 C), temperature source Oral, resp. rate 18, height 5\' 3"  (1.6 m), weight 163 lb 12.8 oz (74.299 kg), last menstrual period 01/17/2013. ECOG:  General appearance: alert Head: Normocephalic, without obvious abnormality, atraumatic Neck: no adenopathy, no carotid bruit, no JVD, supple, symmetrical, trachea midline and thyroid not enlarged, symmetric, no tenderness/mass/nodules Lymph nodes: Cervical, supraclavicular, and axillary nodes normal. Heart:regular rate and rhythm, S1, S2 normal, no murmur, click, rub or gallop Lung:chest clear, no wheezing, rales, normal symmetric air entry Abdomin: soft, non-tender, without masses or organomegaly EXT:no erythema, induration, or nodules   Lab Results: Lab Results  Component Value Date   WBC 5.4 01/24/2013   HGB 12.3 01/24/2013   HCT 37.8 01/24/2013   MCV 88.5 01/24/2013   PLT 247 01/24/2013     Chemistry      Component Value Date/Time   NA 139 01/24/2013 1321  K 3.8 01/24/2013 1321   CL 106 01/24/2013 1321   CO2 26 01/24/2013 1321   BUN 14.8 01/24/2013 1321   CREATININE 0.9 01/24/2013 1321      Component Value Date/Time   CALCIUM 8.9 01/24/2013 1321   ALKPHOS 70 01/24/2013 1321   AST 27 01/24/2013 1321   ALT 21  01/24/2013 1321   BILITOT 0.67 01/24/2013 1321       Radiological Studies: CT CHEST, ABDOMEN AND PELVIS WITH CONTRAST  Technique: Multidetector CT imaging of the chest, abdomen and  pelvis was performed following the standard protocol during bolus  administration of intravenous contrast.  Contrast: OMNIPAQUE IOHEXOL 300 MG/ML SOLN  Comparison: 08/10/2007 ultrasound  CT CHEST  Findings: No pathologic thoracic adenopathy observed. There is  mild volume loss in the right middle lobe with shift of the cardiac  apex to the right, and prominence of the right ventricle.  A 2-3 mm right upper lobe nodule as shown on image 20 of series 4.  Minimal thoracic spondylosis.  IMPRESSION:  1. Prominent right heart, with some mild volume loss in the right  middle lobe and shift of the cardiac apex to the right. No  reversal of the interventricular septum to favor elevated right  heart pressures.  2. 2-3 mm right upper lobe nodule. If the patient is at high risk  for bronchogenic carcinoma, follow-up chest CT at 1 year is  recommended. If the patient is at low risk, no follow-up is  needed. This recommendation follows the consensus statement:  Guidelines for Management of Small Pulmonary Nodules Detected on CT  Scans: A Statement from the Fleischner Society as published in  Radiology 2005; 237:395-400.  CT ABDOMEN AND PELVIS  Findings: The liver, spleen, pancreas, and adrenal glands appear  unremarkable.  Gallbladder contracted. The kidneys appear unremarkable, as do the  proximal ureters.  Prominence of stool throughout the colon suggests constipation. No  pathologic retroperitoneal or porta hepatis adenopathy is  identified.  The left external iliac node measures a right external iliac node  measures 0.8 cm in short axis. No pathologic pelvic adenopathy  observed. No significant bony lesions identified.  The kidneys appear unremarkable, as do the proximal ureters.  There is evidence of  prior cesarean section; uterus otherwise  unremarkable.  IMPRESSION:  1. No pathologic adenopathy or bony lesion observed.  2. Prominence of stool throughout the colon suggests constipation.  METASTATIC BONE SURVEY  Comparison: None.  Findings: Lucency in the posterior calvarium favored to represent  venous lake. Carotid atherosclerosis noted on the left. C5-C6  cervical spondylosis with disc space narrowing and 2 mm  retrolisthesis. Osteophytes are present in the lower cervical  spine.  There are 7 mm and 6 mm lucencies in the C2 vertebra. These may  represent an asymmetric/atypical appearance of transverse foramina.  Osteolytic lesion cannot be excluded. CT would offer better  characterization. There does not appear to be enough obliquity to  account for the spacing of normal symmetric foramina however  anatomic variation and could produce these lucencies.  Lumbar spondylosis. Preserved vertebral body height. Degenerative  disc disease is most pronounced at C4-C5. Pelvic bones appear  intact. No destructive osseous lesions. Phleboliths noted. Femur  appears normal bilaterally without destructive lesions. Humerus  appears normal bilaterally.  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 Abdominal  situs appears to be normal. Thoracic spinal alignment is within  normal limits with mild multilevel spondylosis. Vertebral body  height is preserved. On the frontal  view the lumbar spine, there  is a small granuloma projected over the right iliac wing, likely  gluteal injection granuloma. Calcification in the low right upper  quadrant may represent a small calcified gallstone or renal  calculus.  IMPRESSION:  1. Small lucencies in the C2 vertebra seen on the lateral view  could represent small osteolytic lesions however variation of  foramen transversarium could produce this appearance as well.  Consider follow-up noncontrast CT for further assessment.  2. Dextrocardia with  left sided aortic arch. Abdominal situs  appears normal.  3. Calcification in the right upper quadrant likely represents  small calcified gallstones.   Impression and Plan:  This is a pleasant 52 year old woman with what appeared to be a monoclonal protein IgM subtype. Her M spike is around 0.3 g/dL and her quantitative immunoglobulins are normal.  Her work up did not reveal any evidence to suggest myeloma, lymphoma or any plasma cell disorder.  No further work up is needed, but I will continue to follow her M spike and restage her if this increase over time.  I will see her back in 6 months.    [facility], MD 4/8/20143:21 PM

## 2013-02-14 NOTE — Telephone Encounter (Signed)
Gave pt appt for lab before MD visit on october 2014

## 2013-03-01 ENCOUNTER — Ambulatory Visit (INDEPENDENT_AMBULATORY_CARE_PROVIDER_SITE_OTHER): Payer: BC Managed Care – PPO | Admitting: Psychiatry

## 2013-03-01 ENCOUNTER — Encounter (HOSPITAL_COMMUNITY): Payer: Self-pay | Admitting: Psychiatry

## 2013-03-01 VITALS — Wt 165.0 lb

## 2013-03-01 DIAGNOSIS — F39 Unspecified mood [affective] disorder: Secondary | ICD-10-CM

## 2013-03-01 DIAGNOSIS — F988 Other specified behavioral and emotional disorders with onset usually occurring in childhood and adolescence: Secondary | ICD-10-CM

## 2013-03-01 MED ORDER — LAMOTRIGINE 150 MG PO TABS: 150.0000 mg | ORAL_TABLET | Freq: Two times a day (BID) | ORAL | Status: DC

## 2013-03-01 MED ORDER — DEXTROAMPHETAMINE SULFATE ER 15 MG PO CP24: ORAL_CAPSULE | ORAL | Status: DC
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
## 2013-05-01 ENCOUNTER — Other Ambulatory Visit (HOSPITAL_COMMUNITY): Payer: Self-pay | Admitting: *Deleted

## 2013-05-01 ENCOUNTER — Encounter (HOSPITAL_COMMUNITY): Payer: Self-pay | Admitting: *Deleted

## 2013-05-02 ENCOUNTER — Other Ambulatory Visit (HOSPITAL_COMMUNITY): Payer: Self-pay | Admitting: *Deleted

## 2013-05-02 DIAGNOSIS — F988 Other specified behavioral and emotional disorders with onset usually occurring in childhood and adolescence: Secondary | ICD-10-CM

## 2013-05-02 MED ORDER — DEXTROAMPHETAMINE SULFATE ER 15 MG PO CP24: ORAL_CAPSULE | ORAL | Status: DC

## 2013-05-03 ENCOUNTER — Telehealth (HOSPITAL_COMMUNITY): Payer: Self-pay

## 2013-05-03 NOTE — Telephone Encounter (Signed)
3:42pm 05/03/13 Patient's husband came to pick-up rx-script.Marland KitchenMarguerite Olea

## 2013-05-31 ENCOUNTER — Encounter (HOSPITAL_COMMUNITY): Payer: Self-pay | Admitting: Psychiatry

## 2013-05-31 ENCOUNTER — Ambulatory Visit (INDEPENDENT_AMBULATORY_CARE_PROVIDER_SITE_OTHER): Payer: BC Managed Care – PPO | Admitting: Psychiatry

## 2013-05-31 VITALS — BP 119/81 | HR 71 | Wt 163.8 lb

## 2013-05-31 DIAGNOSIS — F988 Other specified behavioral and emotional disorders with onset usually occurring in childhood and adolescence: Secondary | ICD-10-CM

## 2013-05-31 DIAGNOSIS — F39 Unspecified mood [affective] disorder: Secondary | ICD-10-CM

## 2013-05-31 MED ORDER — DEXTROAMPHETAMINE SULFATE ER 15 MG PO CP24: ORAL_CAPSULE | ORAL | Status: DC

## 2013-05-31 MED ORDER — LAMOTRIGINE 150 MG PO TABS: 150.0000 mg | ORAL_TABLET | Freq: Two times a day (BID) | ORAL | Status: DC
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
## 2013-07-20 ENCOUNTER — Ambulatory Visit
Admission: RE | Admit: 2013-07-20 | Discharge: 2013-07-20 | Disposition: A | Discharge status: Home / Self Care | Payer: BC Managed Care – PPO | Source: Ambulatory Visit | Attending: Internal Medicine | Admitting: Internal Medicine

## 2013-07-20 ENCOUNTER — Other Ambulatory Visit: Payer: Self-pay | Admitting: Internal Medicine

## 2013-07-20 DIAGNOSIS — R079 Chest pain, unspecified: Secondary | ICD-10-CM

## 2013-07-20 DIAGNOSIS — R0602 Shortness of breath: Secondary | ICD-10-CM

## 2013-07-20 DIAGNOSIS — R7989 Other specified abnormal findings of blood chemistry: Secondary | ICD-10-CM

## 2013-07-20 MED ORDER — IOHEXOL 350 MG/ML SOLN
100.0000 mL | Freq: Once | INTRAVENOUS | Status: AC | PRN
  Administered 2013-07-20: 100 mL via INTRAVENOUS

## 2013-08-10 ENCOUNTER — Other Ambulatory Visit (HOSPITAL_COMMUNITY): Payer: Self-pay | Admitting: *Deleted

## 2013-08-10 ENCOUNTER — Telehealth (HOSPITAL_COMMUNITY): Payer: Self-pay

## 2013-08-10 DIAGNOSIS — F988 Other specified behavioral and emotional disorders with onset usually occurring in childhood and adolescence: Secondary | ICD-10-CM

## 2013-08-10 MED ORDER — DEXTROAMPHETAMINE SULFATE ER 15 MG PO CP24: ORAL_CAPSULE | ORAL | Status: DC

## 2013-08-10 NOTE — Telephone Encounter (Signed)
08/10/13 4pm spoke with pt in reference to rx script (DEXEDRINE SPANSULE 15MG ) to pick up/sh

## 2013-08-22 ENCOUNTER — Ambulatory Visit: Payer: Self-pay | Admitting: Oncology

## 2013-08-23 ENCOUNTER — Other Ambulatory Visit (HOSPITAL_BASED_OUTPATIENT_CLINIC_OR_DEPARTMENT_OTHER): Payer: BC Managed Care – PPO

## 2013-08-23 ENCOUNTER — Ambulatory Visit (HOSPITAL_COMMUNITY): Payer: Self-pay | Admitting: Psychiatry

## 2013-08-23 ENCOUNTER — Ambulatory Visit (INDEPENDENT_AMBULATORY_CARE_PROVIDER_SITE_OTHER): Payer: BC Managed Care – PPO | Admitting: Psychiatry

## 2013-08-23 ENCOUNTER — Telehealth: Payer: Self-pay | Admitting: Oncology

## 2013-08-23 ENCOUNTER — Encounter (HOSPITAL_COMMUNITY): Payer: Self-pay | Admitting: Psychiatry

## 2013-08-23 VITALS — BP 110/80 | HR 62 | Ht 63.0 in | Wt 165.2 lb

## 2013-08-23 DIAGNOSIS — D729 Disorder of white blood cells, unspecified: Secondary | ICD-10-CM

## 2013-08-23 DIAGNOSIS — F988 Other specified behavioral and emotional disorders with onset usually occurring in childhood and adolescence: Secondary | ICD-10-CM

## 2013-08-23 DIAGNOSIS — D7289 Other specified disorders of white blood cells: Secondary | ICD-10-CM

## 2013-08-23 DIAGNOSIS — F39 Unspecified mood [affective] disorder: Secondary | ICD-10-CM

## 2013-08-23 LAB — COMPREHENSIVE METABOLIC PANEL (CC13)
ALT: 16 U/L (ref 0–55)
AST: 20 U/L (ref 5–34)
Calcium: 9.4 mg/dL (ref 8.4–10.4)
Chloride: 107 mEq/L (ref 98–109)
Creatinine: 0.8 mg/dL (ref 0.6–1.1)
Sodium: 140 mEq/L (ref 136–145)
Total Protein: 7.1 g/dL (ref 6.4–8.3)

## 2013-08-23 LAB — CBC WITH DIFFERENTIAL/PLATELET
BASO%: 0.6 % (ref 0.0–2.0)
EOS%: 2.6 % (ref 0.0–7.0)
HCT: 41.2 % (ref 34.8–46.6)
MCH: 31.2 pg (ref 25.1–34.0)
MCHC: 33.5 g/dL (ref 31.5–36.0)
NEUT%: 66.2 % (ref 38.4–76.8)
RBC: 4.43 10*6/uL (ref 3.70–5.45)
RDW: 14.2 % (ref 11.2–14.5)
WBC: 6.4 10*3/uL (ref 3.9–10.3)
lymph#: 1.4 10*3/uL (ref 0.9–3.3)

## 2013-08-23 MED ORDER — DEXTROAMPHETAMINE SULFATE ER 15 MG PO CP24: ORAL_CAPSULE | ORAL | Status: DC

## 2013-08-23 MED ORDER — LAMOTRIGINE 150 MG PO TABS: 150.0000 mg | ORAL_TABLET | Freq: Two times a day (BID) | ORAL | Status: DC

## 2013-08-23 NOTE — Telephone Encounter (Signed)
Pt stopped by after lab and r/s 10/21 f/u to 10/29 @ 1:15pm.
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
## 2013-08-25 LAB — SPEP & IFE WITH QIG
Alpha-1-Globulin: 4 % (ref 2.9–4.9)
Alpha-2-Globulin: 9.6 % (ref 7.1–11.8)
Beta Globulin: 5.8 % (ref 4.7–7.2)
Gamma Globulin: 15.9 % (ref 11.1–18.8)
IgG (Immunoglobin G), Serum: 916 mg/dL (ref 690–1700)

## 2013-08-25 LAB — KAPPA/LAMBDA LIGHT CHAINS: Kappa free light chain: 1.35 mg/dL (ref 0.33–1.94)

## 2013-08-29 ENCOUNTER — Ambulatory Visit: Payer: Self-pay | Admitting: Oncology

## 2013-09-06 ENCOUNTER — Ambulatory Visit: Payer: Self-pay | Admitting: Oncology

## 2013-09-07 ENCOUNTER — Ambulatory Visit (HOSPITAL_BASED_OUTPATIENT_CLINIC_OR_DEPARTMENT_OTHER): Payer: BC Managed Care – PPO | Admitting: Oncology

## 2013-09-07 ENCOUNTER — Telehealth: Payer: Self-pay | Admitting: Oncology

## 2013-09-07 ENCOUNTER — Telehealth: Payer: Self-pay | Admitting: *Deleted

## 2013-09-07 VITALS — BP 117/70 | HR 90 | Temp 97.0°F | Resp 18 | Ht 63.0 in | Wt 165.6 lb

## 2013-09-07 DIAGNOSIS — D472 Monoclonal gammopathy: Secondary | ICD-10-CM

## 2013-09-07 NOTE — Telephone Encounter (Signed)
pt came in today for and appt that was yesterday...per Dr. Clelia Croft he will see her today

## 2013-09-07 NOTE — Telephone Encounter (Signed)
appts made and printed...td 

## 2013-09-07 NOTE — Progress Notes (Signed)
Hematology and Oncology Follow Up Visit  Courtney Lee [id number] Jan 12, 1961 52 y.o. 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  Interim History:  Courtney Lee presents today for a follow up visit. She is a nice women with the above history. Since her last visit she reports no new symptoms. She did develop a chest pain associated with shortness of breath in September of 2014 and had a CT scan that ruled out pulmonary embolism.  She had not reported any bony pain, had not had any pathological fractures, had not had any recurrent sinopulmonary  infection or neuropathy. She continued to perform most activities of daily living without any hindrance or decline.   Medications: I have reviewed the patient's current medications. Current Outpatient Prescriptions  Medication Sig Dispense Refill  . B Complex-C (SUPER B COMPLEX PO) Take 1 tablet by mouth daily.      . cetirizine (ZYRTEC) 10 MG tablet Take 10 mg by mouth daily.      . CVS BIOTIN PO Take 10,000 mg by mouth daily.      Marland Kitchen dextroamphetamine (DEXEDRINE SPANSULE) 15 MG 24 hr capsule Take 2 in AM  60 capsule  0  . Ferrous Sulfate (IRON) 325 (65 FE) MG TABS Take 65 mg by mouth daily.      . frovatriptan (FROVA) 2.5 MG tablet Take 2.5 mg by mouth as needed. If recurs, may repeat after 2 hours. Max of 3 tabs in 24 hours.       Marland Kitchen lamoTRIgine (LAMICTAL) 150 MG tablet Take 1 tablet (150 mg total) by mouth 2 (two) times daily.  60 tablet  2  . LUNESTA 2 MG TABS       . Magnesium-Zinc (MAGNESIUM-CHELATED ZINC PO) Take 400 mg by mouth daily.      . montelukast (SINGULAIR) 10 MG tablet        No current facility-administered medications for this visit.    Allergies:  Allergies  Allergen Reactions  . Codeine   . Erythromycin     Past Medical History, Surgical history, Social history, and Family History were  reviewed and updated.  Review of Systems:  Remaining ROS negative. Physical Exam: Blood pressure 117/70, pulse 90, temperature 97 F (36.1 C), temperature source Oral, resp. rate 18, height 5\' 3"  (1.6 m), weight 165 lb 9.6 oz (75.116 kg), SpO2 98.00%. ECOG:  General appearance: alert Head: Normocephalic, without obvious abnormality, atraumatic Neck: no adenopathy, no carotid bruit, no JVD, supple, symmetrical, trachea midline and thyroid not enlarged, symmetric, no tenderness/mass/nodules Lymph nodes: Cervical, supraclavicular, and axillary nodes normal. Heart:regular rate and rhythm, S1, S2 normal, no murmur, click, rub or gallop Lung:chest clear, no wheezing, rales, normal symmetric air entry Abdomin: soft, non-tender, without masses or organomegaly EXT:no erythema, induration, or nodules   Lab Results: Lab Results  Component Value Date   WBC 6.4 08/23/2013   HGB 13.8 08/23/2013   HCT 41.2 08/23/2013   MCV 93.1 08/23/2013   PLT 236 08/23/2013     Chemistry      Component Value Date/Time   NA 140 08/23/2013 0958   K 4.0 08/23/2013 0958   CL 106 01/24/2013 1321   CO2 24 08/23/2013 0958   BUN 13.7 08/23/2013 0958   CREATININE 0.8 08/23/2013 0958      Component Value Date/Time   CALCIUM 9.4 08/23/2013 0958   ALKPHOS 62 08/23/2013 0958   AST 20  08/23/2013 0958   ALT 16 08/23/2013 0958   BILITOT 0.95 08/23/2013 0958      Results for Courtney Lee (MRN [id number]) as of 09/07/2013 13:34  Ref. Range 01/24/2013 13:21 08/23/2013 09:59  M-SPIKE, % No range found 0.32 0.27  Results for Courtney Lee (MRN [id number]) as of 09/07/2013 13:34  Ref. 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 Her M  spike is around 0.27 g/dL and her quantitative immunoglobulins are normal.  Her work up did not reveal any evidence to suggest myeloma, lymphoma or any plasma cell disorder.  No further work up is needed, but I will continue to follow her M spike and restage her if this increase over time.  I will see her back in 9 months.    Eli Hose, MD 10/30/20141:43 PM

## 2013-09-14 ENCOUNTER — Other Ambulatory Visit: Payer: Self-pay

## 2013-11-15 ENCOUNTER — Other Ambulatory Visit (HOSPITAL_COMMUNITY): Payer: Self-pay | Admitting: *Deleted

## 2013-11-15 ENCOUNTER — Telehealth (HOSPITAL_COMMUNITY): Payer: Self-pay | Admitting: *Deleted

## 2013-11-15 DIAGNOSIS — F988 Other specified behavioral and emotional disorders with onset usually occurring in childhood and adolescence: Secondary | ICD-10-CM

## 2013-11-15 MED ORDER — DEXTROAMPHETAMINE SULFATE ER 15 MG PO CP24: ORAL_CAPSULE | ORAL | Status: DC

## 2013-11-15 NOTE — Telephone Encounter (Signed)
RX for dexedrine did not print first time. Reprinted

## 2013-11-15 NOTE — Telephone Encounter (Signed)
RX refilled  

## 2013-11-23 ENCOUNTER — Ambulatory Visit (HOSPITAL_COMMUNITY): Payer: Self-pay | Admitting: Psychiatry

## 2013-11-24 ENCOUNTER — Ambulatory Visit (HOSPITAL_COMMUNITY): Payer: Self-pay | Admitting: Psychiatry

## 2013-11-28 ENCOUNTER — Encounter (HOSPITAL_COMMUNITY): Payer: Self-pay | Admitting: Psychiatry

## 2013-11-28 ENCOUNTER — Ambulatory Visit (INDEPENDENT_AMBULATORY_CARE_PROVIDER_SITE_OTHER): Payer: BC Managed Care – PPO | Admitting: Psychiatry

## 2013-11-28 VITALS — BP 135/90 | HR 83 | Ht 63.0 in | Wt 162.0 lb

## 2013-11-28 DIAGNOSIS — F988 Other specified behavioral and emotional disorders with onset usually occurring in childhood and adolescence: Secondary | ICD-10-CM

## 2013-11-28 MED ORDER — DEXTROAMPHETAMINE SULFATE ER 15 MG PO CP24: ORAL_CAPSULE | ORAL | Status: DC

## 2013-11-28 MED ORDER — LAMOTRIGINE 150 MG PO TABS: 150.0000 mg | ORAL_TABLET | Freq: Two times a day (BID) | ORAL | Status: DC
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
## 2014-02-20 ENCOUNTER — Other Ambulatory Visit: Payer: Self-pay | Admitting: Obstetrics and Gynecology

## 2014-02-20 ENCOUNTER — Telehealth (HOSPITAL_COMMUNITY): Payer: Self-pay | Admitting: *Deleted

## 2014-02-20 DIAGNOSIS — Z1231 Encounter for screening mammogram for malignant neoplasm of breast: Secondary | ICD-10-CM

## 2014-02-20 DIAGNOSIS — F988 Other specified behavioral and emotional disorders with onset usually occurring in childhood and adolescence: Secondary | ICD-10-CM

## 2014-02-20 MED ORDER — DEXTROAMPHETAMINE SULFATE ER 15 MG PO CP24: ORAL_CAPSULE | ORAL | Status: DC

## 2014-02-20 NOTE — Telephone Encounter (Signed)
RX refilled  

## 2014-02-23 ENCOUNTER — Telehealth (HOSPITAL_COMMUNITY): Payer: Self-pay

## 2014-02-23 NOTE — Telephone Encounter (Signed)
02/23/14 10:51am Patient's son came to pick-up rx script - s/w mother - son Percell Boston) [id number].Marland KitchenMariana Kaufman

## 2014-02-26 ENCOUNTER — Ambulatory Visit (HOSPITAL_COMMUNITY)
Admission: RE | Admit: 2014-02-26 | Discharge: 2014-02-26 | Disposition: A | Discharge status: Home / Self Care | Payer: BC Managed Care – PPO | Source: Ambulatory Visit | Attending: Obstetrics and Gynecology | Admitting: Obstetrics and Gynecology

## 2014-02-26 ENCOUNTER — Encounter (HOSPITAL_COMMUNITY): Payer: Self-pay | Admitting: Psychiatry

## 2014-02-26 ENCOUNTER — Ambulatory Visit (INDEPENDENT_AMBULATORY_CARE_PROVIDER_SITE_OTHER): Payer: BC Managed Care – PPO | Admitting: Psychiatry

## 2014-02-26 VITALS — BP 119/66 | HR 95 | Ht 63.0 in | Wt 160.2 lb

## 2014-02-26 DIAGNOSIS — F988 Other specified behavioral and emotional disorders with onset usually occurring in childhood and adolescence: Secondary | ICD-10-CM

## 2014-02-26 DIAGNOSIS — F329 Major depressive disorder, single episode, unspecified: Secondary | ICD-10-CM

## 2014-02-26 DIAGNOSIS — Z1231 Encounter for screening mammogram for malignant neoplasm of breast: Secondary | ICD-10-CM | POA: Insufficient documentation

## 2014-02-26 MED ORDER — DEXTROAMPHETAMINE SULFATE ER 15 MG PO CP24: ORAL_CAPSULE | ORAL | Status: DC

## 2014-02-26 MED ORDER — DEXTROAMPHETAMINE SULFATE ER 15 MG PO CP24
ORAL_CAPSULE | ORAL | Status: DC
Start: 2014-02-26 — End: 2014-02-26

## 2014-02-26 MED ORDER — LAMOTRIGINE 150 MG PO TABS: 150.0000 mg | ORAL_TABLET | Freq: Two times a day (BID) | ORAL | Status: DC
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
## 2014-05-23 ENCOUNTER — Other Ambulatory Visit (HOSPITAL_BASED_OUTPATIENT_CLINIC_OR_DEPARTMENT_OTHER): Payer: BC Managed Care – PPO

## 2014-05-23 DIAGNOSIS — D472 Monoclonal gammopathy: Secondary | ICD-10-CM

## 2014-05-23 LAB — COMPREHENSIVE METABOLIC PANEL (CC13)
ALBUMIN: 4 g/dL (ref 3.5–5.0)
ALT: 21 U/L (ref 0–55)
ANION GAP: 8 meq/L (ref 3–11)
AST: 26 U/L (ref 5–34)
Alkaline Phosphatase: 69 U/L (ref 40–150)
BUN: 13.8 mg/dL (ref 7.0–26.0)
CALCIUM: 9.6 mg/dL (ref 8.4–10.4)
CHLORIDE: 107 meq/L (ref 98–109)
CO2: 24 meq/L (ref 22–29)
CREATININE: 0.9 mg/dL (ref 0.6–1.1)
Glucose: 94 mg/dl (ref 70–140)
POTASSIUM: 4.4 meq/L (ref 3.5–5.1)
Sodium: 139 mEq/L (ref 136–145)
Total Bilirubin: 0.76 mg/dL (ref 0.20–1.20)
Total Protein: 7.2 g/dL (ref 6.4–8.3)

## 2014-05-23 LAB — CBC WITH DIFFERENTIAL/PLATELET
BASO%: 1 % (ref 0.0–2.0)
Basophils Absolute: 0.1 10*3/uL (ref 0.0–0.1)
EOS%: 2.2 % (ref 0.0–7.0)
Eosinophils Absolute: 0.1 10*3/uL (ref 0.0–0.5)
HEMATOCRIT: 43.2 % (ref 34.8–46.6)
HGB: 13.9 g/dL (ref 11.6–15.9)
LYMPH%: 18.2 % (ref 14.0–49.7)
MCH: 30.2 pg (ref 25.1–34.0)
MCHC: 32.1 g/dL (ref 31.5–36.0)
MCV: 94.3 fL (ref 79.5–101.0)
MONO#: 0.6 10*3/uL (ref 0.1–0.9)
MONO%: 8.4 % (ref 0.0–14.0)
NEUT%: 70.2 % (ref 38.4–76.8)
NEUTROS ABS: 4.7 10*3/uL (ref 1.5–6.5)
PLATELETS: 296 10*3/uL (ref 145–400)
RBC: 4.58 10*6/uL (ref 3.70–5.45)
RDW: 13.7 % (ref 11.2–14.5)
WBC: 6.6 10*3/uL (ref 3.9–10.3)
lymph#: 1.2 10*3/uL (ref 0.9–3.3)

## 2014-05-25 LAB — SPEP & IFE WITH QIG
ALPHA-1-GLOBULIN: 3.9 % (ref 2.9–4.9)
Albumin ELP: 60.8 % (ref 55.8–66.1)
Alpha-2-Globulin: 9.4 % (ref 7.1–11.8)
BETA 2: 4.7 % (ref 3.2–6.5)
Beta Globulin: 5.6 % (ref 4.7–7.2)
GAMMA GLOBULIN: 15.6 % (ref 11.1–18.8)
IGA: 232 mg/dL (ref 69–380)
IgG (Immunoglobin G), Serum: 831 mg/dL (ref 690–1700)
IgM, Serum: 269 mg/dL (ref 52–322)
M-Spike, %: 0.27 g/dL
Total Protein, Serum Electrophoresis: 6.7 g/dL (ref 6.0–8.3)

## 2014-05-28 ENCOUNTER — Encounter (HOSPITAL_COMMUNITY): Payer: Self-pay | Admitting: Psychiatry

## 2014-05-28 ENCOUNTER — Ambulatory Visit (INDEPENDENT_AMBULATORY_CARE_PROVIDER_SITE_OTHER): Payer: BC Managed Care – PPO | Admitting: Psychiatry

## 2014-05-28 ENCOUNTER — Telehealth: Payer: Self-pay | Admitting: Oncology

## 2014-05-28 VITALS — BP 107/74 | HR 83 | Ht 63.0 in | Wt 155.8 lb

## 2014-05-28 DIAGNOSIS — F988 Other specified behavioral and emotional disorders with onset usually occurring in childhood and adolescence: Secondary | ICD-10-CM

## 2014-05-28 MED ORDER — DEXTROAMPHETAMINE SULFATE ER 15 MG PO CP24: ORAL_CAPSULE | ORAL | Status: DC

## 2014-05-28 MED ORDER — LAMOTRIGINE 150 MG PO TABS: 150.0000 mg | ORAL_TABLET | Freq: Two times a day (BID) | ORAL | Status: DC

## 2014-05-28 NOTE — Telephone Encounter (Signed)
moved 7/22 appt to 7/29 due to CME. d/t per staff message from [facility]. s/w pt she is aware.

## 2014-05-28 NOTE — Progress Notes (Signed)
[facility] [phone] Progress Note  Courtney Lee [id number] 53 y.o.  05/28/2014 10:20 AM  Chief Complaint:  Medication management and followup.  History of Present Illness: Courtney Lee came for her followup appointment.  She had a very good trip with her husband.  She went to [place] for 14 days.  She enjoyed every day .  She is taking her medication and denies any side effects.  She is also very happy because she is migraine headaches free for long time.  She is taking organic vitamins which are gluten-free .  She has seen a dramatic response with these vitamins.  She has not a single migraine headaches.  She is sleeping better.  She described her mood is much improved.  She denies any irritability or anger.  Her appetite is good.  She is watching her weight very closely.  She lost 5 pounds from the last visit. Her son is moving out soon since he is enrolled in college and wants to study EMT. Patient denies any tremors, rash, itching.  She denies any severe panic attack.  She was to continue Dexedrine and Lamictal.  She lives with her husband.  Suicidal Ideation: No Plan Formed: No Patient has means to carry out plan: No  Homicidal Ideation: No Plan Formed: No Patient has means to carry out plan: No  Review of Systems: Psychiatric: Agitation: No Hallucination: No Depressed Mood: No Insomnia: No Hypersomnia: No Altered Concentration: No Feels Worthless: No Grandiose Ideas: No Belief In Special Powers: No New/Increased Substance Abuse: No Compulsions: No  Neurologic: Headache: Yes Seizure: No Paresthesias: No  Medical history. Patient has history of anemia, asthma and headaches.  Her primary care physician is Dr. Nehemiah Lee  Outpatient Encounter Prescriptions as of 05/28/2014  Medication Sig  . cetirizine (ZYRTEC) 10 MG tablet Take 10 mg by mouth daily.  Marland Kitchen dextroamphetamine (DEXEDRINE SPANSULE) 15 MG 24 hr capsule Take 2 in AM  . dextroamphetamine  (DEXEDRINE SPANSULE) 15 MG 24 hr capsule Take 2 in AM  . Ferrous Sulfate (IRON) 325 (65 FE) MG TABS Take 65 mg by mouth daily.  Marland Kitchen lamoTRIgine (LAMICTAL) 150 MG tablet Take 1 tablet (150 mg total) by mouth 2 (two) times daily.  . [DISCONTINUED] dextroamphetamine (DEXEDRINE SPANSULE) 15 MG 24 hr capsule Take 2 in AM  . [DISCONTINUED] dextroamphetamine (DEXEDRINE SPANSULE) 15 MG 24 hr capsule Take 2 in AM  . [DISCONTINUED] lamoTRIgine (LAMICTAL) 150 MG tablet Take 1 tablet (150 mg total) by mouth 2 (two) times daily.  . [DISCONTINUED] B Complex-C (SUPER B COMPLEX PO) Take 1 tablet by mouth daily.  . [DISCONTINUED] [employer] BIOTIN PO Take 10,000 mg by mouth daily.  . [DISCONTINUED] frovatriptan (FROVA) 2.5 MG tablet Take 2.5 mg by mouth as needed. If recurs, may repeat after 2 hours. Max of 3 tabs in 24 hours.   . [DISCONTINUED] Magnesium-Zinc (MAGNESIUM-CHELATED ZINC PO) Take 400 mg by mouth daily.  . [DISCONTINUED] montelukast (SINGULAIR) 10 MG tablet     Past Psychiatric History/Hospitalization(s): Patient is a long history of depression and ADD. In the past she has taken Prozac Effexor however she had a good response with Lamictal and Dexedrine.  Anxiety: Yes Bipolar Disorder: No Depression: Yes Mania: No Psychosis: No Schizophrenia: No Personality Disorder: No Hospitalization for psychiatric illness: No History of Electroconvulsive Shock Therapy: No Prior Suicide Attempts: No  Physical Exam: Constitutional:  BP 107/74  Pulse 83  Ht 5\' 3"  (1.6 m)  Wt 155 lb 12.8 oz (70.67 kg)  BMI  27.61 kg/m2  Recent Results (from the past 2160 hour(s))  CBC WITH DIFFERENTIAL     Status: None   Collection Time    05/23/14  1:17 PM      Result Value Ref Range   WBC 6.6  3.9 - 10.3 10e3/uL   NEUT# 4.7  1.5 - 6.5 10e3/uL   HGB 13.9  11.6 - 15.9 g/dL   HCT 43.2  34.8 - 46.6 %   Platelets 296  145 - 400 10e3/uL   MCV 94.3  79.5 - 101.0 fL   MCH 30.2  25.1 - 34.0 pg   MCHC 32.1  31.5 - 36.0 g/dL    RBC 4.58  3.70 - 5.45 10e6/uL   RDW 13.7  11.2 - 14.5 %   lymph# 1.2  0.9 - 3.3 10e3/uL   MONO# 0.6  0.1 - 0.9 10e3/uL   Eosinophils Absolute 0.1  0.0 - 0.5 10e3/uL   Basophils Absolute 0.1  0.0 - 0.1 10e3/uL   NEUT% 70.2  38.4 - 76.8 %   LYMPH% 18.2  14.0 - 49.7 %   MONO% 8.4  0.0 - 14.0 %   EOS% 2.2  0.0 - 7.0 %   BASO% 1.0  0.0 - 2.0 %  SPEP & IFE WITH QIG     Status: None   Collection Time    05/23/14  1:18 PM      Result Value Ref Range   IgG (Immunoglobin G), Serum 831  690 - 1700 mg/dL   IgA 232  69 - 380 mg/dL   IgM, Serum 269  52 - 322 mg/dL   Immunofix Electr Int *     Comment: Monoclonal IgM kappa protein is present.Reviewed by Odis Hollingshead, MD, PhD, FCAP (Electronic Signature onFile)   Total Protein, Serum Electrophoresis 6.7  6.0 - 8.3 g/dL   Albumin ELP 60.8  55.8 - 66.1 %   Alpha-1-Globulin 3.9  2.9 - 4.9 %   Alpha-2-Globulin 9.4  7.1 - 11.8 %   Beta Globulin 5.6  4.7 - 7.2 %   Beta 2 4.7  3.2 - 6.5 %   Gamma Globulin 15.6  11.1 - 18.8 %   M-Spike, % 0.27     SPE Interp. *     Comment: A restricted band consistent with monoclonal protein is present.The monoclonal protein peak accounts for 0.27 g/dL of the total1.05 g/dL of protein in the gamma region.Results are consistent with SPE performed on 08/24/2013  Reviewed byJanice Darla Lesches, MD, PhD, FCAP (Electronic Signature on File)   COMMENT (PROTEIN ELECTROPHOR) *     Comment: ---------------Serum protein electrophoresis is a useful screening procedure in thedetection of various pathophysiologic states such as inflammation,gammopathies, protein loss and other dysproteinemias.  Immunofixationelectrophoresis (IFE) is a more      sensitive technique for theidentification of M-proteins found in patients with monoclonalgammopathy of unknown significance (MGUS), amyloidosis, early ortreated myeloma or macroglobulinemia, solitary plasmacytoma orextramedullary plasmacytoma.  COMPREHENSIVE METABOLIC PANEL (UY40)      Status: None   Collection Time    05/23/14  1:18 PM      Result Value Ref Range   Sodium 139  136 - 145 mEq/L   Potassium 4.4  3.5 - 5.1 mEq/L   Chloride 107  98 - 109 mEq/L   CO2 24  22 - 29 mEq/L   Glucose 94  70 - 140 mg/dl   BUN 13.8  7.0 - 26.0 mg/dL   Creatinine 0.9  0.6 - 1.1 mg/dL   Total Bilirubin 0.76  0.20 - 1.20 mg/dL   Alkaline Phosphatase 69  40 - 150 U/L   AST 26  5 - 34 U/L   ALT 21  0 - 55 U/L   Total Protein 7.2  6.4 - 8.3 g/dL   Albumin 4.0  3.5 - 5.0 g/dL   Calcium 9.6  8.4 - 10.4 mg/dL   Anion Gap 8  3 - 11 mEq/L    General Appearance: alert, oriented, no acute distress and well nourished  Musculoskeletal: Strength & Muscle Tone: within normal limits Gait & Station: normal Patient leans: N/A  Psychiatric: Speech (describe rate, volume, coherence, spontaneity, and abnormalities if any):  Clear and coherent with normal tone and volume.  Thought Process (describe rate, content, abstract reasoning, and computation): Logical and goal-directed.  Associations: Coherent and Relevant  Thoughts: normal  Mental Status: Orientation: oriented to person, place and time/date Mood & Affect: normal affect Attention Span & Concentration: good  Established Problem, Stable/Improving (1), Review of Psycho-Social Stressors (1), Review or order clinical lab tests (1), Review of Last Therapy Session (1) and Review of Medication Regimen & Side Effects (2)  Assessment: Axis I: Depressive disorder NOS, attention deficit disorder  Axis II: Deferred  Axis III: See medical history  Axis IV: Mild to moderate  Axis V: 65-70   Plan:  Patient is stable on her current medication.  Recommend to continue Lamictal and Dexedrine.  I reviewed her blood work which is normal.  She is taking organic multivitamins which is helping her migraine headaches.  She is taking alpha crs, XEO Mega and Micro Plex.  Patient does not exhibit any side effects from the vitamins and her current  psychotropic medication.  Recommended to call us back if she has any question or any concern.  Followup in 3 months.  Trayveon Beckford T., MD 05/28/2014

## 2014-05-30 ENCOUNTER — Ambulatory Visit: Payer: Self-pay | Admitting: Oncology

## 2014-06-06 ENCOUNTER — Encounter: Payer: Self-pay | Admitting: Oncology

## 2014-06-06 ENCOUNTER — Telehealth: Payer: Self-pay | Admitting: Oncology

## 2014-06-06 ENCOUNTER — Ambulatory Visit (HOSPITAL_BASED_OUTPATIENT_CLINIC_OR_DEPARTMENT_OTHER): Payer: BC Managed Care – PPO | Admitting: Oncology

## 2014-06-06 VITALS — BP 122/69 | HR 86 | Temp 98.2°F | Resp 18 | Ht 63.0 in | Wt 156.6 lb

## 2014-06-06 DIAGNOSIS — D472 Monoclonal gammopathy: Secondary | ICD-10-CM

## 2014-06-06 NOTE — Progress Notes (Signed)
Hematology and Oncology Follow Up Visit  Courtney Lee [id number] 01/14/1961 53 y.o. 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  Interim History:  Courtney Lee presents today for a follow up visit by her self. Since her last visit she reports no new symptoms. She had not reported any bony pain, had not had any pathological fractures, had not had any recurrent sinopulmonary infection or neuropathy. She continued to perform most activities of daily living without any hindrance or decline. She has not reported any headaches or blurry vision and her migraines have improved. She does not report any syncope or seizures. She does not report any fevers or chills or sweats. Does not report any chest pain or difficulty breathing. Has not reported any nausea or vomiting or abdominal pain. Has not reported any arthralgias or myalgias. As of her review of systems unremarkable.   Medications: I have reviewed the patient's current medications. Current Outpatient Prescriptions  Medication Sig Dispense Refill  . cetirizine (ZYRTEC) 10 MG tablet Take 10 mg by mouth daily.      Marland Kitchen dextroamphetamine (DEXEDRINE SPANSULE) 15 MG 24 hr capsule Take 2 in AM  60 capsule  0  . Ferrous Sulfate (IRON) 325 (65 FE) MG TABS Take 65 mg by mouth daily.      Marland Kitchen lamoTRIgine (LAMICTAL) 150 MG tablet Take 1 tablet (150 mg total) by mouth 2 (two) times daily.  60 tablet  2   No current facility-administered medications for this visit.    Allergies:  Allergies  Allergen Reactions  . Codeine   . Erythromycin     Past Medical History, Surgical history, Social history, and Family History were reviewed and updated.   Physical Exam: Blood pressure 122/69, pulse 86, temperature 98.2 F (36.8 C), temperature source Oral, resp. rate 18, height 5\' 3"  (1.6 m), weight 156 lb 9.6 oz (71.033  kg). ECOG:  General appearance: alert Head: Normocephalic, without obvious abnormality, atraumatic Neck: no adenopathy Lymph nodes: Cervical, supraclavicular, and axillary nodes normal. Heart:regular rate and rhythm, S1, S2 normal, no murmur, click, rub or gallop Lung:chest clear, no wheezing, rales, normal symmetric air entry Abdomin: soft, non-tender, without masses or organomegaly EXT:no erythema, induration, or nodules   Lab Results: Lab Results  Component Value Date   WBC 6.6 05/23/2014   HGB 13.9 05/23/2014   HCT 43.2 05/23/2014   MCV 94.3 05/23/2014   PLT 296 05/23/2014     Chemistry      Component Value Date/Time   NA 139 05/23/2014 1318   K 4.4 05/23/2014 1318   CL 106 01/24/2013 1321   CO2 24 05/23/2014 1318   BUN 13.8 05/23/2014 1318   CREATININE 0.9 05/23/2014 1318      Component Value Date/Time   CALCIUM 9.6 05/23/2014 1318   ALKPHOS 69 05/23/2014 1318   AST 26 05/23/2014 1318   ALT 21 05/23/2014 1318   BILITOT 0.76 05/23/2014 1318     Results for Courtney, Lee (MRN [id number]) as of 06/06/2014 16:27  Ref. Range 01/24/2013 13:21 08/23/2013 09:59 05/23/2014 13:18  M-SPIKE, % No range found 0.32 0.27 0.27  SPE Interp. 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 Her M spike is around 0.27 g/dL and her quantitative immunoglobulins are normal.  Her work up did not reveal any evidence to suggest myeloma, lymphoma or any plasma cell disorder.  No further work up is needed at this time. I will continue to follow her M spike every 12 months and restage her if this increase over time.    [facility], MD 7/29/20154:24 PM

## 2014-06-06 NOTE — Telephone Encounter (Signed)
gv and printed aptp sched and avs for pt for July 2016

## 2014-08-22 ENCOUNTER — Telehealth (HOSPITAL_COMMUNITY): Payer: Self-pay | Admitting: *Deleted

## 2014-08-22 ENCOUNTER — Other Ambulatory Visit (HOSPITAL_COMMUNITY): Payer: Self-pay | Admitting: *Deleted

## 2014-08-22 DIAGNOSIS — F988 Other specified behavioral and emotional disorders with onset usually occurring in childhood and adolescence: Secondary | ICD-10-CM

## 2014-08-22 MED ORDER — DEXTROAMPHETAMINE SULFATE ER 15 MG PO CP24: ORAL_CAPSULE | ORAL | Status: DC

## 2014-08-22 NOTE — Telephone Encounter (Signed)
Called pt to inform her that her rx is ready for her to pick up. lmtcb 4:16pm on her mobile.

## 2014-08-22 NOTE — Telephone Encounter (Signed)
Per Dr. Adele Schilder to refill pt medication for 1 month until pt appt. Called pt to inform her that her rx is ready for her to pick up. lmtcb 4:16pm on her mobile.

## 2014-08-23 ENCOUNTER — Telehealth (HOSPITAL_COMMUNITY): Payer: Self-pay

## 2014-08-23 NOTE — Telephone Encounter (Signed)
Aaron Edelman, spouse picked up prescription on 08/23/14 DL [id number]  dlo

## 2014-08-28 ENCOUNTER — Ambulatory Visit (INDEPENDENT_AMBULATORY_CARE_PROVIDER_SITE_OTHER): Payer: BC Managed Care – PPO | Admitting: Psychiatry

## 2014-08-28 ENCOUNTER — Encounter (HOSPITAL_COMMUNITY): Payer: Self-pay | Admitting: Psychiatry

## 2014-08-28 VITALS — BP 109/68 | HR 76 | Ht 63.0 in | Wt 159.6 lb

## 2014-08-28 DIAGNOSIS — F329 Major depressive disorder, single episode, unspecified: Secondary | ICD-10-CM

## 2014-08-28 DIAGNOSIS — F909 Attention-deficit hyperactivity disorder, unspecified type: Secondary | ICD-10-CM

## 2014-08-28 DIAGNOSIS — F988 Other specified behavioral and emotional disorders with onset usually occurring in childhood and adolescence: Secondary | ICD-10-CM

## 2014-08-28 MED ORDER — LAMOTRIGINE 150 MG PO TABS: 150.0000 mg | ORAL_TABLET | Freq: Two times a day (BID) | ORAL | Status: DC

## 2014-08-28 MED ORDER — DEXTROAMPHETAMINE SULFATE ER 15 MG PO CP24: ORAL_CAPSULE | ORAL | Status: DC

## 2014-08-28 NOTE — Progress Notes (Signed)
[facility] Progress Note  Courtney Lee [id number] 53 y.o.  08/28/2014 11:32 AM  Chief Complaint:  Medication management and followup.  History of Present Illness: Courtney Lee came for her followup appointment.  She is taking her medication and denies any side effects.  She has been busy in recent months.  She had a good time with her husband.  She takes Dexedrine and Lamictal every day.  Her attention and focus is good.  She denies any crying spells irritability or any anger.  Her sleep is good.  Her appetite is okay.  She denies any major migraine headaches in recent months.  She is a stick with a gluten-free diet and it is helping her headaches.  She has no rash or itching.  Her weight is stable.  Her vitals are okay.  She is happy that her son graduated from EMT and now looking for a job.  The patient was to continue her current medication.  She is living with her husband and she reported relationship is going very well.  Suicidal Ideation: No Plan Formed: No Patient has means to carry out plan: No  Homicidal Ideation: No Plan Formed: No Patient has means to carry out plan: No  Review of Systems: Psychiatric: Agitation: No Hallucination: No Depressed Mood: No Insomnia: No Hypersomnia: No Altered Concentration: No Feels Worthless: No Grandiose Ideas: No Belief In Special Powers: No New/Increased Substance Abuse: No Compulsions: No  Neurologic: Headache: Yes Seizure: No Paresthesias: No  Medical history. Patient has history of anemia, asthma and headaches.  Her primary care physician is Dr. Nehemiah Settle  Outpatient Encounter Prescriptions as of 08/28/2014  Medication Sig  . cetirizine (ZYRTEC) 10 MG tablet Take 10 mg by mouth daily.  Marland Kitchen dextroamphetamine (DEXEDRINE SPANSULE) 15 MG 24 hr capsule Take 2 in AM  . Ferrous Sulfate (IRON) 325 (65 FE) MG TABS Take 65 mg by mouth daily.  Marland Kitchen lamoTRIgine (LAMICTAL) 150 MG tablet Take 1 tablet (150 mg total)  by mouth 2 (two) times daily.  . [DISCONTINUED] dextroamphetamine (DEXEDRINE SPANSULE) 15 MG 24 hr capsule Take 2 in AM  . [DISCONTINUED] dextroamphetamine (DEXEDRINE SPANSULE) 15 MG 24 hr capsule Take 2 in AM  . [DISCONTINUED] lamoTRIgine (LAMICTAL) 150 MG tablet Take 1 tablet (150 mg total) by mouth 2 (two) times daily.    Past Psychiatric History/Hospitalization(s): Patient is a long history of depression and ADD. In the past she has taken Prozac Effexor however she had a good response with Lamictal and Dexedrine.  Anxiety: Yes Bipolar Disorder: No Depression: Yes Mania: No Psychosis: No Schizophrenia: No Personality Disorder: No Hospitalization for psychiatric illness: No History of Electroconvulsive Shock Therapy: No Prior Suicide Attempts: No  Physical Exam: Constitutional:  BP 109/68  Pulse 76  Ht 5\' 3"  (1.6 m)  Wt 159 lb 9.6 oz (72.394 kg)  BMI 28.28 kg/m2  No results found for this or any previous visit (from the past 2160 hour(s)).  General Appearance: alert, oriented, no acute distress and well nourished  Musculoskeletal: Strength & Muscle Tone: within normal limits Gait & Station: normal Patient leans: N/A  Psychiatric: Speech (describe rate, volume, coherence, spontaneity, and abnormalities if any):  Clear and coherent with normal tone and volume.  Thought Process (describe rate, content, abstract reasoning, and computation): Logical and goal-directed.  Associations: Coherent and Relevant  Thoughts: normal  Mental Status: Orientation: oriented to person, place and time/date Mood & Affect: normal affect Attention Span & Concentration: good  Established Problem, Stable/Improving (1), Review  of Last Therapy Session (1) and Review of Medication Regimen & Side Effects (2)  Assessment: Axis I: Depressive disorder NOS, attention deficit disorder  Axis II: Deferred  Axis III: See medical history  Axis IV: Mild to moderate  Axis V: 65-70   Plan:   Patient is stable on her current medication.  She does not have any rash or itching with Lamictal.  I will continue Lamictal 150 mg 2 times a day and Dexedrine 15 mg 2 tablet in the morning .  Recommended to call us back if she has any question or any concern.  Followup in 3 months.  [name] T., MD 08/28/2014

## 2014-09-10 ENCOUNTER — Encounter (HOSPITAL_COMMUNITY): Payer: Self-pay | Admitting: Psychiatry

## 2014-10-21 ENCOUNTER — Encounter: Payer: Self-pay | Admitting: Oncology

## 2014-11-16 ENCOUNTER — Telehealth (HOSPITAL_COMMUNITY): Payer: Self-pay | Admitting: *Deleted

## 2014-11-16 DIAGNOSIS — F988 Other specified behavioral and emotional disorders with onset usually occurring in childhood and adolescence: Secondary | ICD-10-CM

## 2014-11-16 MED ORDER — DEXTROAMPHETAMINE SULFATE ER 15 MG PO CP24: ORAL_CAPSULE | ORAL | Status: DC

## 2014-11-16 NOTE — Telephone Encounter (Signed)
Called requesting refill of Dexedrine. Chart reviewed. Refill appropriate. Called to verify that patient has been taking daily. Pt states she has and needs refill. Printed for Doctor to sign.

## 2014-11-19 ENCOUNTER — Telehealth (HOSPITAL_COMMUNITY): Payer: Self-pay

## 2014-11-19 NOTE — Telephone Encounter (Signed)
[place] picked up prescription on 11/19/14  DL  [id number] [facility]  dlo

## 2014-12-04 ENCOUNTER — Encounter (HOSPITAL_COMMUNITY): Payer: Self-pay | Admitting: Psychiatry

## 2014-12-04 ENCOUNTER — Ambulatory Visit (INDEPENDENT_AMBULATORY_CARE_PROVIDER_SITE_OTHER): Payer: BLUE CROSS/BLUE SHIELD | Admitting: Psychiatry

## 2014-12-04 VITALS — BP 134/86 | HR 86 | Ht 63.0 in | Wt 159.8 lb

## 2014-12-04 DIAGNOSIS — F329 Major depressive disorder, single episode, unspecified: Secondary | ICD-10-CM | POA: Diagnosis not present

## 2014-12-04 DIAGNOSIS — F909 Attention-deficit hyperactivity disorder, unspecified type: Secondary | ICD-10-CM

## 2014-12-04 DIAGNOSIS — F988 Other specified behavioral and emotional disorders with onset usually occurring in childhood and adolescence: Secondary | ICD-10-CM

## 2014-12-04 MED ORDER — DEXTROAMPHETAMINE SULFATE ER 15 MG PO CP24: ORAL_CAPSULE | ORAL | Status: DC

## 2014-12-04 MED ORDER — LAMOTRIGINE 150 MG PO TABS
150.0000 mg | ORAL_TABLET | Freq: Two times a day (BID) | ORAL | Status: DC
Start: 2014-12-04 — End: 2015-02-26

## 2014-12-04 NOTE — Progress Notes (Signed)
Courtney Lee [phone] Progress Note  Courtney Lee [id number] 54 y.o.  12/04/2014 10:56 AM  Chief Complaint:  Medication management and followup.  History of Present Illness: Courtney Lee came for her followup appointment.  She is compliant with her medication and denies any side effects.  She has no rash or itching.  Her mood has been stable.  She is excited because her son is getting married next month in [place].  Patient told all her family is going and she is happy about it.  Her other son graduated from EMT. Patient denies any tremors or shakes.  Her attention and focus is good.  She sleeping good.  She denies any recent complain of headaches. She is a stick with a gluten-free diet and it is helping her migraine headaches.  Patient denies any feeling of hopelessness or worthlessness or any crying spells.  Her appetite is okay.  Her vitals are stable.  She is living with her husband and she reported relationship is going very well.  Suicidal Ideation: No Plan Formed: No Patient has means to carry out plan: No  Homicidal Ideation: No Plan Formed: No Patient has means to carry out plan: No  Review of Systems: Psychiatric: Agitation: No Hallucination: No Depressed Mood: No Insomnia: No Hypersomnia: No Altered Concentration: No Feels Worthless: No Grandiose Ideas: No Belief In Special Powers: No New/Increased Substance Abuse: No Compulsions: No  Neurologic: Headache: Yes Seizure: No Paresthesias: No  Medical history. Patient has history of anemia, asthma and headaches.  Her primary care physician is Dr. Nehemiah Settle  Outpatient Encounter Prescriptions as of 12/04/2014  Medication Sig  . cetirizine (ZYRTEC) 10 MG tablet Take 10 mg by mouth daily.  Marland Kitchen dextroamphetamine (DEXEDRINE SPANSULE) 15 MG 24 hr capsule Take 2 in AM  . Ferrous Sulfate (IRON) 325 (65 FE) MG TABS Take 65 mg by mouth daily.  Marland Kitchen lamoTRIgine (LAMICTAL) 150 MG tablet Take 1 tablet (150 mg total) by  mouth 2 (two) times daily.  . [DISCONTINUED] dextroamphetamine (DEXEDRINE SPANSULE) 15 MG 24 hr capsule Take 2 in AM  . [DISCONTINUED] dextroamphetamine (DEXEDRINE SPANSULE) 15 MG 24 hr capsule Take 2 in AM  . [DISCONTINUED] dextroamphetamine (DEXEDRINE SPANSULE) 15 MG 24 hr capsule Take 2 in AM  . [DISCONTINUED] lamoTRIgine (LAMICTAL) 150 MG tablet Take 1 tablet (150 mg total) by mouth 2 (two) times daily.    Past Psychiatric History/Hospitalization(s): Patient is a long history of depression and ADD. In the past she has taken Prozac Effexor however she had a good response with Lamictal and Dexedrine.  Anxiety: Yes Bipolar Disorder: No Depression: Yes Mania: No Psychosis: No Schizophrenia: No Personality Disorder: No Hospitalization for psychiatric illness: No History of Electroconvulsive Shock Therapy: No Prior Suicide Attempts: No  Physical Exam: Constitutional:  BP 134/86 mmHg  Pulse 86  Ht 5\' 3"  (1.6 m)  Wt 159 lb 12.8 oz (72.485 kg)  BMI 28.31 kg/m2  No results found for this or any previous visit (from the past 2160 hour(s)).  General Appearance: alert, oriented, no acute distress and well nourished  Musculoskeletal: Strength & Muscle Tone: within normal limits Gait & Station: normal Patient leans: N/A  Psychiatric: Speech (describe rate, volume, coherence, spontaneity, and abnormalities if any):  Clear and coherent with normal tone and volume.  Thought Process (describe rate, content, abstract reasoning, and computation): Logical and goal-directed.  Associations: Coherent and Relevant  Thoughts: normal  Mental Status: Orientation: oriented to person, place and time/date Mood & Affect: normal affect Attention Span &  Concentration: good  Established Problem, Stable/Improving (1), Review of Last Therapy Session (1) and Review of Medication Regimen & Side Effects (2)  Assessment: Axis I: Depressive disorder NOS, attention deficit disorder  Axis II:  Deferred  Axis III: See medical history  Axis IV: Mild to moderate  Axis V: 65-70   Plan:  Patient is stable on her current medication.  She does not have any rash or itching with Lamictal.  I will continue Lamictal 150 mg 2 times a day and Dexedrine 15 mg 2 tablet in the morning .  Recommended to call us back if she has any question or any concern.  Followup in 3 months.  ARFEEN,SYED T., MD 12/04/2014

## 2015-02-26 ENCOUNTER — Encounter (HOSPITAL_COMMUNITY): Payer: Self-pay | Admitting: Psychiatry

## 2015-02-26 ENCOUNTER — Ambulatory Visit (INDEPENDENT_AMBULATORY_CARE_PROVIDER_SITE_OTHER): Payer: BLUE CROSS/BLUE SHIELD | Admitting: Psychiatry

## 2015-02-26 VITALS — BP 113/71 | HR 76 | Ht 63.0 in | Wt 163.0 lb

## 2015-02-26 DIAGNOSIS — F988 Other specified behavioral and emotional disorders with onset usually occurring in childhood and adolescence: Secondary | ICD-10-CM

## 2015-02-26 DIAGNOSIS — F909 Attention-deficit hyperactivity disorder, unspecified type: Secondary | ICD-10-CM

## 2015-02-26 DIAGNOSIS — F329 Major depressive disorder, single episode, unspecified: Secondary | ICD-10-CM

## 2015-02-26 MED ORDER — DEXTROAMPHETAMINE SULFATE ER 15 MG PO CP24: ORAL_CAPSULE | ORAL | Status: DC

## 2015-02-26 MED ORDER — LAMOTRIGINE 150 MG PO TABS: 150.0000 mg | ORAL_TABLET | Freq: Two times a day (BID) | ORAL | Status: DC

## 2015-02-26 NOTE — Progress Notes (Signed)
[facility] [phone] Progress Note  Courtney Lee [id number] 54 y.o.  02/26/2015 11:19 AM  Chief Complaint:  Medication management and followup.  History of Present Illness: Courtney Lee came for her followup appointment.  She is taking her medication as prescribed.  She is very happy because she believe that her son sweating was phenomenal.  She had a good time in [place] and everything went very well.  She was able to see her family members and she is happy that everyone is doing very well.  She show pictures of the wedding.  Overall her mood has been stable.  She denies any irritability, anger, mood swing.  Her attention concentration is good.  She is able to do multitasking.  Her appetite is okay.  Her vitals are stable. She is living with her husband and she reported relationship is going very well.  Suicidal Ideation: No Plan Formed: No Patient has means to carry out plan: No  Homicidal Ideation: No Plan Formed: No Patient has means to carry out plan: No  Review of Systems: Psychiatric: Agitation: No Hallucination: No Depressed Mood: No Insomnia: No Hypersomnia: No Altered Concentration: No Feels Worthless: No Grandiose Ideas: No Belief In Special Powers: No New/Increased Substance Abuse: No Compulsions: No  Neurologic: Headache: Yes Seizure: No Paresthesias: No  Medical history. Patient has history of anemia, asthma and headaches.  Her primary care physician is Dr. Nehemiah Settle  Outpatient Encounter Prescriptions as of 02/26/2015  Medication Sig  . adapalene (DIFFERIN) 0.1 % gel   . cetirizine (ZYRTEC) 10 MG tablet Take 10 mg by mouth daily.  Marland Kitchen dextroamphetamine (DEXEDRINE SPANSULE) 15 MG 24 hr capsule Take 2 in AM  . Ferrous Sulfate (IRON) 325 (65 FE) MG TABS Take 65 mg by mouth daily.  . FROVA 2.5 MG tablet   . lamoTRIgine (LAMICTAL) 150 MG tablet Take 1 tablet (150 mg total) by mouth 2 (two) times daily.  Marland Kitchen PROAIR HFA 108 (90 BASE) MCG/ACT inhaler    . [DISCONTINUED] dextroamphetamine (DEXEDRINE SPANSULE) 15 MG 24 hr capsule Take 2 in AM  . [DISCONTINUED] dextroamphetamine (DEXEDRINE SPANSULE) 15 MG 24 hr capsule Take 2 in AM  . [DISCONTINUED] dextroamphetamine (DEXEDRINE SPANSULE) 15 MG 24 hr capsule Take 2 in AM  . [DISCONTINUED] lamoTRIgine (LAMICTAL) 150 MG tablet Take 1 tablet (150 mg total) by mouth 2 (two) times daily.    Past Psychiatric History/Hospitalization(s): Patient is a long history of depression and ADD. In the past she has taken Prozac Effexor however she had a good response with Lamictal and Dexedrine.  Anxiety: Yes Bipolar Disorder: No Depression: Yes Mania: No Psychosis: No Schizophrenia: No Personality Disorder: No Hospitalization for psychiatric illness: No History of Electroconvulsive Shock Therapy: No Prior Suicide Attempts: No  Physical Exam: Constitutional:  BP 113/71 mmHg  Pulse 76  Ht 5\' 3"  (1.6 m)  Wt 163 lb (73.936 kg)  BMI 28.88 kg/m2  No results found for this or any previous visit (from the past 2160 hour(s)).  General Appearance: alert, oriented, no acute distress and well nourished  Musculoskeletal: Strength & Muscle Tone: within normal limits Gait & Station: normal Patient leans: N/A  Psychiatric: Speech (describe rate, volume, coherence, spontaneity, and abnormalities if any):  Clear and coherent with normal tone and volume.  Thought Process (describe rate, content, abstract reasoning, and computation): Logical and goal-directed.  Associations: Coherent and Relevant  Thoughts: normal  Mental Status: Orientation: oriented to person, place and time/date Mood & Affect: normal affect Attention Span & Concentration:  good  Established Problem, Stable/Improving (1), Review of Last Therapy Session (1) and Review of Medication Regimen & Side Effects (2)  Assessment: Axis I: Depressive disorder NOS, attention deficit disorder  Axis II: Deferred  Axis III: See medical  history  Plan:  Patient is stable on her current medication.  She does not have any rash or itching with Lamictal.  I will continue Lamictal 150 mg 2 times a day and Dexedrine 15 mg 2 tablet in the morning .  Recommended to call us back if she has any question or any concern.  Followup in 3 months.  ARFEEN,SYED T., MD 02/26/2015

## 2015-04-22 ENCOUNTER — Other Ambulatory Visit (HOSPITAL_COMMUNITY): Payer: Self-pay | Admitting: Psychiatry

## 2015-04-23 ENCOUNTER — Telehealth: Payer: Self-pay | Admitting: Oncology

## 2015-04-23 ENCOUNTER — Other Ambulatory Visit (HOSPITAL_COMMUNITY): Payer: Self-pay | Admitting: Psychiatry

## 2015-04-23 NOTE — Telephone Encounter (Signed)
Pt called to r/s labs, I called pt left msg to call back to r/s labs... KJ

## 2015-04-23 NOTE — Telephone Encounter (Signed)
Given on April 19 with 2 refills.

## 2015-04-29 ENCOUNTER — Telehealth: Payer: Self-pay | Admitting: Oncology

## 2015-04-29 NOTE — Telephone Encounter (Signed)
Returned patient call re rescheduling 7/19 lab appointment to 7/12. Spoke with patient and gave new appointment for lab 7/12 @ 1:30 pm. Also confirmed 06/04/15 appointment with FS.

## 2015-05-21 ENCOUNTER — Other Ambulatory Visit (HOSPITAL_BASED_OUTPATIENT_CLINIC_OR_DEPARTMENT_OTHER): Payer: BLUE CROSS/BLUE SHIELD

## 2015-05-21 DIAGNOSIS — D472 Monoclonal gammopathy: Secondary | ICD-10-CM | POA: Diagnosis not present

## 2015-05-21 LAB — COMPREHENSIVE METABOLIC PANEL (CC13)
ALBUMIN: 4 g/dL (ref 3.5–5.0)
ALT: 24 U/L (ref 0–55)
AST: 26 U/L (ref 5–34)
Alkaline Phosphatase: 70 U/L (ref 40–150)
Anion Gap: 6 mEq/L (ref 3–11)
BUN: 16 mg/dL (ref 7.0–26.0)
CALCIUM: 9.5 mg/dL (ref 8.4–10.4)
CHLORIDE: 107 meq/L (ref 98–109)
CO2: 27 mEq/L (ref 22–29)
CREATININE: 0.9 mg/dL (ref 0.6–1.1)
EGFR: 69 mL/min/{1.73_m2} — AB (ref >90)
Glucose: 111 mg/dl (ref 70–140)
POTASSIUM: 4.3 meq/L (ref 3.5–5.1)
SODIUM: 141 meq/L (ref 136–145)
Total Bilirubin: 0.63 mg/dL (ref 0.20–1.20)
Total Protein: 7 g/dL (ref 6.4–8.3)

## 2015-05-21 LAB — CBC WITH DIFFERENTIAL/PLATELET
BASO%: 0.8 % (ref 0.0–2.0)
BASOS ABS: 0 10*3/uL (ref 0.0–0.1)
EOS ABS: 0.1 10*3/uL (ref 0.0–0.5)
EOS%: 2.6 % (ref 0.0–7.0)
HCT: 41 % (ref 34.8–46.6)
HGB: 13.5 g/dL (ref 11.6–15.9)
LYMPH%: 24.6 % (ref 14.0–49.7)
MCH: 30.6 pg (ref 25.1–34.0)
MCHC: 32.9 g/dL (ref 31.5–36.0)
MCV: 93 fL (ref 79.5–101.0)
MONO#: 0.5 10*3/uL (ref 0.1–0.9)
MONO%: 9.3 % (ref 0.0–14.0)
NEUT%: 62.7 % (ref 38.4–76.8)
NEUTROS ABS: 3.3 10*3/uL (ref 1.5–6.5)
Platelets: 257 10*3/uL (ref 145–400)
RBC: 4.41 10*6/uL (ref 3.70–5.45)
RDW: 13.6 % (ref 11.2–14.5)
WBC: 5.3 10*3/uL (ref 3.9–10.3)
lymph#: 1.3 10*3/uL (ref 0.9–3.3)

## 2015-05-23 LAB — SPEP & IFE WITH QIG
ABNORMAL PROTEIN BAND1: 0.3 g/dL
ALBUMIN ELP: 4 g/dL (ref 3.8–4.8)
Alpha-1-Globulin: 0.3 g/dL (ref 0.2–0.3)
Alpha-2-Globulin: 0.6 g/dL (ref 0.5–0.9)
BETA 2: 0.3 g/dL (ref 0.2–0.5)
BETA GLOBULIN: 0.4 g/dL (ref 0.4–0.6)
Gamma Globulin: 1.1 g/dL (ref 0.8–1.7)
IGA: 243 mg/dL (ref 69–380)
IGM, SERUM: 253 mg/dL (ref 52–322)
IgG (Immunoglobin G), Serum: 965 mg/dL (ref 690–1700)
Total Protein, Serum Electrophoresis: 6.6 g/dL (ref 6.1–8.1)

## 2015-05-28 ENCOUNTER — Other Ambulatory Visit: Payer: Self-pay

## 2015-05-28 ENCOUNTER — Ambulatory Visit (HOSPITAL_COMMUNITY): Payer: Self-pay | Admitting: Psychiatry

## 2015-06-04 ENCOUNTER — Ambulatory Visit: Payer: Self-pay | Admitting: Oncology

## 2015-06-20 ENCOUNTER — Ambulatory Visit (INDEPENDENT_AMBULATORY_CARE_PROVIDER_SITE_OTHER): Payer: BLUE CROSS/BLUE SHIELD | Admitting: Psychiatry

## 2015-06-20 ENCOUNTER — Encounter (HOSPITAL_COMMUNITY): Payer: Self-pay | Admitting: Psychiatry

## 2015-06-20 VITALS — BP 116/82 | HR 84 | Ht 63.0 in | Wt 161.8 lb

## 2015-06-20 DIAGNOSIS — F909 Attention-deficit hyperactivity disorder, unspecified type: Secondary | ICD-10-CM | POA: Diagnosis not present

## 2015-06-20 DIAGNOSIS — F329 Major depressive disorder, single episode, unspecified: Secondary | ICD-10-CM

## 2015-06-20 DIAGNOSIS — F988 Other specified behavioral and emotional disorders with onset usually occurring in childhood and adolescence: Secondary | ICD-10-CM

## 2015-06-20 MED ORDER — DEXTROAMPHETAMINE SULFATE ER 15 MG PO CP24: ORAL_CAPSULE | ORAL | Status: DC

## 2015-06-20 MED ORDER — LAMOTRIGINE 150 MG PO TABS: 150.0000 mg | ORAL_TABLET | Freq: Two times a day (BID) | ORAL | Status: DC

## 2015-06-20 NOTE — Progress Notes (Signed)
[facility] Progress Note  Courtney Lee [id number] 54 y.o.  06/20/2015 2:35 PM  Chief Complaint:  Medication management and followup.  History of Present Illness: Courtney Lee came for her followup appointment.  She is compliant with her medication.  She recently visited her family member in [place].  She had a good trip.  She denies any irritability, anger or any crying spells.  Her attention and focus is good.  She sleeping okay.  She denies any racing thoughts or any hallucination.  She is able to do multitasking.  She has no tremors, shakes or any side effects.  Her vitals are stable.  Her appetite is okay.  Patient denies drinking or using any illegal substances.  Patient lives with her husband and reported relationship is going very well.  She had blood work last month which was normal.  Suicidal Ideation: No Plan Formed: No Patient has means to carry out plan: No  Homicidal Ideation: No Plan Formed: No Patient has means to carry out plan: No  Review of Systems: Psychiatric: Agitation: No Hallucination: No Depressed Mood: No Insomnia: No Hypersomnia: No Altered Concentration: No Feels Worthless: No Grandiose Ideas: No Belief In Special Powers: No New/Increased Substance Abuse: No Compulsions: No  Neurologic: Headache: Yes Seizure: No Paresthesias: No  Medical history. Patient has history of anemia, asthma and headaches.  Her primary care physician is Dr. Nehemiah Lee  Outpatient Encounter Prescriptions as of 06/20/2015  Medication Sig  . adapalene (DIFFERIN) 0.1 % gel   . cetirizine (ZYRTEC) 10 MG tablet Take 10 mg by mouth daily.  Marland Kitchen dextroamphetamine (DEXEDRINE SPANSULE) 15 MG 24 hr capsule Take 2 in AM  . Ferrous Sulfate (IRON) 325 (65 FE) MG TABS Take 65 mg by mouth daily.  . FROVA 2.5 MG tablet   . lamoTRIgine (LAMICTAL) 150 MG tablet Take 1 tablet (150 mg total) by mouth 2 (two) times daily.  Marland Kitchen PROAIR HFA 108 (90 BASE) MCG/ACT inhaler    . [DISCONTINUED] dextroamphetamine (DEXEDRINE SPANSULE) 15 MG 24 hr capsule Take 2 in AM  . [DISCONTINUED] dextroamphetamine (DEXEDRINE SPANSULE) 15 MG 24 hr capsule Take 2 in AM  . [DISCONTINUED] dextroamphetamine (DEXEDRINE SPANSULE) 15 MG 24 hr capsule Take 2 in AM  . [DISCONTINUED] lamoTRIgine (LAMICTAL) 150 MG tablet Take 1 tablet (150 mg total) by mouth 2 (two) times daily.   No facility-administered encounter medications on file as of 06/20/2015.    Past Psychiatric History/Hospitalization(s): Patient is a long history of depression and ADD. In the past she has taken Prozac Effexor however she had a good response with Lamictal and Dexedrine.  Anxiety: Yes Bipolar Disorder: No Depression: Yes Mania: No Psychosis: No Schizophrenia: No Personality Disorder: No Hospitalization for psychiatric illness: No History of Electroconvulsive Shock Therapy: No Prior Suicide Attempts: No  Physical Exam: Constitutional:  BP 116/82 mmHg  Pulse 84  Ht '5\' 3"'  (1.6 m)  Wt 161 lb 12.8 oz (73.392 kg)  BMI 28.67 kg/m2  Recent Results (from the past 2160 hour(s))  CBC with Differential     Status: None   Collection Time: 05/21/15  1:30 PM  Result Value Ref Range   WBC 5.3 3.9 - 10.3 10e3/uL   NEUT# 3.3 1.5 - 6.5 10e3/uL   HGB 13.5 11.6 - 15.9 g/dL   HCT 41.0 34.8 - 46.6 %   Platelets 257 145 - 400 10e3/uL   MCV 93.0 79.5 - 101.0 fL   MCH 30.6 25.1 - 34.0 pg   MCHC 32.9  31.5 - 36.0 g/dL   RBC 4.41 3.70 - 5.45 10e6/uL   RDW 13.6 11.2 - 14.5 %   lymph# 1.3 0.9 - 3.3 10e3/uL   MONO# 0.5 0.1 - 0.9 10e3/uL   Eosinophils Absolute 0.1 0.0 - 0.5 10e3/uL   Basophils Absolute 0.0 0.0 - 0.1 10e3/uL   NEUT% 62.7 38.4 - 76.8 %   LYMPH% 24.6 14.0 - 49.7 %   MONO% 9.3 0.0 - 14.0 %   EOS% 2.6 0.0 - 7.0 %   BASO% 0.8 0.0 - 2.0 %  Comprehensive metabolic panel     Status: Abnormal   Collection Time: 05/21/15  1:30 PM  Result Value Ref Range   Sodium 141 136 - 145 mEq/L   Potassium 4.3 3.5 - 5.1  mEq/L   Chloride 107 98 - 109 mEq/L   CO2 27 22 - 29 mEq/L   Glucose 111 70 - 140 mg/dl   BUN 16.0 7.0 - 26.0 mg/dL   Creatinine 0.9 0.6 - 1.1 mg/dL   Total Bilirubin 0.63 0.20 - 1.20 mg/dL   Alkaline Phosphatase 70 40 - 150 U/L   AST 26 5 - 34 U/L   ALT 24 0 - 55 U/L   Total Protein 7.0 6.4 - 8.3 g/dL   Albumin 4.0 3.5 - 5.0 g/dL   Calcium 9.5 8.4 - 10.4 mg/dL   Anion Gap 6 3 - 11 mEq/L   EGFR 69 (L) >90 ml/min/1.73 m2    Comment: eGFR is calculated using the CKD-EPI Creatinine Equation (2009)  SPEP & IFE with QIG     Status: None   Collection Time: 05/21/15  1:30 PM  Result Value Ref Range   IgG (Immunoglobin G), Serum 965 690 - 1700 mg/dL   IgA 243 69 - 380 mg/dL   IgM, Serum 253 52 - 322 mg/dL   Immunofix Electr Int *     Comment: Monoclonal IgM kappa protein is present.Reviewed by Odis Hollingshead, MD, PhD, FCAP (Electronic Signature onFile)   Total Protein, Serum Electrophoresis 6.6 6.1 - 8.1 g/dL   Albumin ELP 4.0 3.8 - 4.8 g/dL   Alpha-1-Globulin 0.3 0.2 - 0.3 g/dL   Alpha-2-Globulin 0.6 0.5 - 0.9 g/dL   Beta Globulin 0.4 0.4 - 0.6 g/dL   Beta 2 0.3 0.2 - 0.5 g/dL   Gamma Globulin 1.1 0.8 - 1.7 g/dL   Abnormal Protein Band1 0.3 g/dL   SPE Interp. *     Comment: A restricted band consistent with monoclonal protein is present.The monoclonal protein peak accounts for 0.3 g/dL of the total1.0 g/dL of protein in the gamma region. Results are consistent with SPE performed on 05/24/2014 Reviewed by Odis Hollingshead,  MD, PhD, FCAP (Electronic Signature onFile)    COMMENT (PROTEIN ELECTROPHOR) *     Comment: ---------------Serum protein electrophoresis is a useful screening procedure in thedetection of various pathophysiologic states such as inflammation,gammopathies, protein loss and other dysproteinemias.  Immunofixationelectrophoresis (IFE) is a more  sensitive technique for theidentification of M-proteins found in patients with monoclonalgammopathy of unknown significance  (MGUS), amyloidosis, early ortreated myeloma or macroglobulinemia, solitary plasmacytoma orextramedullary plasmacytoma.    Abnormal Protein Band2 NOT DET g/dL   Abnormal Protein Band3 NOT DET g/dL    General Appearance: alert, oriented, no acute distress and well nourished  Musculoskeletal: Strength & Muscle Tone: within normal limits Gait & Station: normal Patient leans: N/A  Psychiatric: Speech (describe rate, volume, coherence, spontaneity, and abnormalities if any):  Clear and coherent with normal  tone and volume.  Thought Process (describe rate, content, abstract reasoning, and computation): Logical and goal-directed.  Associations: Coherent and Relevant  Thoughts: normal  Mental Status: Orientation: oriented to person, place and time/date Mood & Affect: normal affect Attention Span & Concentration: good  Established Problem, Stable/Improving (1), Review or order clinical lab tests (1), Review of Last Therapy Session (1) and Review of Medication Regimen & Side Effects (2)  Assessment: Axis I: Depressive disorder NOS, attention deficit disorder  Axis II: Deferred  Axis III: See medical history  Plan:  I discussed blood work results which is normal.  Patient is a stable on her current medication.  She has no rash or itching with them Lamictal.  I will continue Lamictal 150 mg 2 times a day and Dexedrine 15 mg 2 tablet in the morning .  Recommended to call us back if she has any question or any concern.  Followup in 3 months.  Brinnley Lacap T., MD 06/20/2015

## 2015-07-31 ENCOUNTER — Ambulatory Visit (HOSPITAL_BASED_OUTPATIENT_CLINIC_OR_DEPARTMENT_OTHER): Payer: BLUE CROSS/BLUE SHIELD | Admitting: Oncology

## 2015-07-31 VITALS — BP 121/78 | HR 78 | Temp 97.9°F | Resp 18 | Ht 63.0 in | Wt 158.5 lb

## 2015-07-31 DIAGNOSIS — D729 Disorder of white blood cells, unspecified: Secondary | ICD-10-CM

## 2015-07-31 NOTE — Progress Notes (Signed)
Hematology and Oncology Follow Up Visit  Courtney Lee [id number] 1961/06/23 54 y.o. 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 Likely reactive and less likely a sign of a plasma cell disorder.  Interim History:  [name] presents today for a follow up visit by her self. Since her last visit, she continues to do well. She had not reported any bony pain, had not had any pathological fractures, had not had any recurrent sinopulmonary infection or neuropathy. She continued to perform most activities of daily living without any hindrance or decline.  She has not reported any recent hospitalization or illnesses. She continues to follow up with psychiatry for her mood disorder. She has not had any recent changes in her mood as well.  She has not reported any headaches or blurry vision and her migraines have improved. She does not report any syncope or seizures. She does not report any fevers or chills or sweats. Does not report any chest pain or difficulty breathing. Has not reported any nausea or vomiting or abdominal pain. Has not reported any arthralgias or myalgias. As of her review of systems unremarkable.   Medications: I have reviewed the patient's current medications. Current Outpatient Prescriptions  Medication Sig Dispense Refill  . adapalene (DIFFERIN) 0.1 % gel   0  . cetirizine (ZYRTEC) 10 MG tablet Take 10 mg by mouth daily.    Marland Kitchen dextroamphetamine (DEXEDRINE SPANSULE) 15 MG 24 hr capsule Take 2 in AM 60 capsule 0  . Ferrous Sulfate (IRON) 325 (65 FE) MG TABS Take 65 mg by mouth daily.    . FROVA 2.5 MG tablet   0  . lamoTRIgine (LAMICTAL) 150 MG tablet Take 1 tablet (150 mg total) by mouth 2 (two) times daily. 60 tablet 2  . PROAIR HFA 108 (90 BASE) MCG/ACT inhaler   0   No current facility-administered medications for this visit.    Allergies:  Allergies  Allergen  Reactions  . Codeine   . Erythromycin     Past Medical History, Surgical history, Social history, and Family History were reviewed and updated.   Physical Exam:  vitals personally reviewed today and showed a blood pressure 121/78. Her pulse is 78, respiration 18 with the temperature is 97.9. ECOG: 0 General appearance: alert awake without distress. Head: Normocephalic, without obvious abnormality, atraumatic oral mucosa is moist and pink. Neck: no adenopathy Lymph nodes: Cervical, supraclavicular, and axillary nodes normal. Heart:regular rate and rhythm, S1, S2 normal, no murmur, click, rub or gallop Lung:chest clear, no wheezing, rales, normal symmetric air entry Abdomin: soft, non-tender, without masses or organomegaly no dullness to percussion or shifting dullness. EXT:no erythema, induration, or nodules   Lab Results: Lab Results  Component Value Date   WBC 5.3 05/21/2015   HGB 13.5 05/21/2015   HCT 41.0 05/21/2015   MCV 93.0 05/21/2015   PLT 257 05/21/2015     Chemistry      Component Value Date/Time   NA 141 05/21/2015 1330   K 4.3 05/21/2015 1330   CL 106 01/24/2013 1321   CO2 27 05/21/2015 1330   BUN 16.0 05/21/2015 1330   CREATININE 0.9 05/21/2015 1330      Component Value Date/Time   CALCIUM 9.5 05/21/2015 1330   ALKPHOS 70 05/21/2015 1330   AST 26 05/21/2015 1330   ALT 24 05/21/2015 1330   BILITOT 0.63 05/21/2015 1330      Results for Courtney Lee (MRN [id number]) as of 07/31/2015 13:44  Ref. Range 01/24/2013 13:21 08/23/2013 09:59 05/23/2014 13:18 05/21/2015 13:30  M-SPIKE, % Latest Units: g/dL 0.32 0.27 0.27        Impression and Plan:  This is a 54 year old woman with:  1. Monoclonal protein IgM subtype. Her M spike is around 0.27 g/dL and her quantitative immunoglobulins are normal.  Her work up did not reveal any evidence to suggest myeloma, lymphoma or any plasma cell disorder.  Her laboratory data reviewed in the last 2 years without any  evidence to suggest lymphoproliferative disorder or plasma cell disorder. Her M spike remains very low without any changes. I have recommended no further follow-up at this time I'll be happy to see her in the future as needed. This is most likely reactive in nature.   2. Mood disorder. Follows up with psychiatry regarding this issue.   3. Follow-up: I'll be happy to see her in the future as needed.    Courtney Button, Lee 9/21/20161:41 PM

## 2015-09-11 ENCOUNTER — Encounter (HOSPITAL_COMMUNITY): Payer: Self-pay | Admitting: Psychiatry

## 2015-09-11 ENCOUNTER — Ambulatory Visit (INDEPENDENT_AMBULATORY_CARE_PROVIDER_SITE_OTHER): Payer: BLUE CROSS/BLUE SHIELD | Admitting: Psychiatry

## 2015-09-11 VITALS — BP 122/76 | HR 73 | Ht 63.0 in | Wt 159.2 lb

## 2015-09-11 DIAGNOSIS — F909 Attention-deficit hyperactivity disorder, unspecified type: Secondary | ICD-10-CM | POA: Diagnosis not present

## 2015-09-11 DIAGNOSIS — F988 Other specified behavioral and emotional disorders with onset usually occurring in childhood and adolescence: Secondary | ICD-10-CM

## 2015-09-11 MED ORDER — DEXTROAMPHETAMINE SULFATE ER 15 MG PO CP24: ORAL_CAPSULE | ORAL | Status: DC

## 2015-09-11 MED ORDER — LAMOTRIGINE 150 MG PO TABS: 150.0000 mg | ORAL_TABLET | Freq: Two times a day (BID) | ORAL | Status: DC

## 2015-09-11 NOTE — Progress Notes (Signed)
[facility] Progress Note  Courtney Lee [id number] 54 y.o.  09/11/2015 11:12 AM  Chief Complaint:  Medication management and followup.  History of Present Illness: Courtney Lee came for her followup appointment.  She is taking her medication as prescribed.  She denies any irritability, anger, mood swing.  Her sleep is good.  She is no longer taking Zyrtec and she has noticed improvement in her sleep since then.  She has been traveling a lot to [place] to visit her family members.  Her mom is sick .  She is hoping to spend Thanksgiving in [place] .  She is excited as her son may come and visit them.  Patient has no side effects with the medication.  She has no tremors, shakes, itching or any rash.  Her energy level is good.  She is able to do multitasking and her attention concentration is good.  She denies any paranoia or any hallucination.  She denies any mania or any psychosis.  She denies drinking or using any illegal substances.  Her appetite is okay.  Her vitals are stable.  Patient lives with her husband and reported relationship is going very well.  Suicidal Ideation: No Plan Formed: No Patient has means to carry out plan: No  Homicidal Ideation: No Plan Formed: No Patient has means to carry out plan: No  Review of Systems: Psychiatric: Agitation: No Hallucination: No Depressed Mood: No Insomnia: No Hypersomnia: No Altered Concentration: No Feels Worthless: No Grandiose Ideas: No Belief In Special Powers: No New/Increased Substance Abuse: No Compulsions: No  Neurologic: Headache: Yes Seizure: No Paresthesias: No  Medical history. Patient has history of anemia, asthma and headaches.  Her primary care physician is Dr. Nehemiah Settle  Outpatient Encounter Prescriptions as of 09/11/2015  Medication Sig  . adapalene (DIFFERIN) 0.1 % gel   . dextroamphetamine (DEXEDRINE SPANSULE) 15 MG 24 hr capsule Take 2 in AM  . Ferrous Sulfate (IRON) 325  (65 FE) MG TABS Take 65 mg by mouth daily.  . FROVA 2.5 MG tablet   . lamoTRIgine (LAMICTAL) 150 MG tablet Take 1 tablet (150 mg total) by mouth 2 (two) times daily.  Marland Kitchen PROAIR HFA 108 (90 BASE) MCG/ACT inhaler   . [DISCONTINUED] cetirizine (ZYRTEC) 10 MG tablet Take 10 mg by mouth daily.  . [DISCONTINUED] dextroamphetamine (DEXEDRINE SPANSULE) 15 MG 24 hr capsule Take 2 in AM  . [DISCONTINUED] dextroamphetamine (DEXEDRINE SPANSULE) 15 MG 24 hr capsule Take 2 in AM  . [DISCONTINUED] dextroamphetamine (DEXEDRINE SPANSULE) 15 MG 24 hr capsule Take 2 in AM  . [DISCONTINUED] lamoTRIgine (LAMICTAL) 150 MG tablet Take 1 tablet (150 mg total) by mouth 2 (two) times daily.   No facility-administered encounter medications on file as of 09/11/2015.    Past Psychiatric History/Hospitalization(s): Patient is a long history of depression and ADD. In the past she has taken Prozac Effexor however she had a good response with Lamictal and Dexedrine.  Anxiety: Yes Bipolar Disorder: No Depression: Yes Mania: No Psychosis: No Schizophrenia: No Personality Disorder: No Hospitalization for psychiatric illness: No History of Electroconvulsive Shock Therapy: No Prior Suicide Attempts: No  Physical Exam: Constitutional:  BP 122/76 mmHg  Pulse 73  Ht 5\' 3"  (1.6 m)  Wt 159 lb 3.2 oz (72.213 kg)  BMI 28.21 kg/m2  No results found for this or any previous visit (from the past 2160 hour(s)).  General Appearance: alert, oriented, no acute distress and well nourished  Musculoskeletal: Strength & Muscle Tone: within normal limits  Gait & Station: normal Patient leans: N/A  Psychiatric Specialty Exam: Physical Exam  Review of Systems  Skin: Negative for itching and rash.  Neurological: Negative for dizziness, tremors and headaches.    Blood pressure 122/76, pulse 73, height 5\' 3"  (1.6 m), weight 159 lb 3.2 oz (72.213 kg).Body mass index is 28.21 kg/(m^2).  General Appearance: Casual  Eye Contact::   Good  Speech:  Normal Rate  Volume:  Normal  Mood:  Euthymic  Affect:  Appropriate  Thought Process:  Coherent  Orientation:  Full (Time, Place, and Person)  Thought Content:  WDL  Suicidal Thoughts:  No  Homicidal Thoughts:  No  Memory:  Immediate;   Good Recent;   Good Remote;   Good  Judgement:  Good  Insight:  Good  Psychomotor Activity:  Normal  Concentration:  Good  Recall:  Good  Fund of Knowledge:  Good  Language:  Good  Akathisia:  No  Handed:  Right  AIMS (if indicated):     Assets:  Communication Skills Desire for Improvement Financial Resources/Insurance Housing Physical Health Social Support  ADL's:  Intact  Cognition:  WNL  Sleep:       Established Problem, Stable/Improving (1), Review of Last Therapy Session (1) and Review of Medication Regimen & Side Effects (2)  Assessment: Axis I: Depressive disorder NOS, attention deficit disorder  Axis II: Deferred  Axis III: See medical history  Plan:  Patient is a stable on her current psychiatric medication.  She prefer a 90 day supply of Lamictal as she is traveling back and forth to [place] to visit her mother.  Discussed medication side effects and benefits.  Continue Lamictal 150 mg twice a day and Dexedrine 15 mg 2 tablet in the morning.  Recommended to call us back if she has any question or any concern.  Follow-up in 3 months.   Illyria Sobocinski T., MD 09/11/2015

## 2015-09-24 ENCOUNTER — Ambulatory Visit (HOSPITAL_COMMUNITY): Payer: Self-pay | Admitting: Psychiatry

## 2015-12-12 ENCOUNTER — Ambulatory Visit (HOSPITAL_COMMUNITY): Payer: Self-pay | Admitting: Psychiatry

## 2015-12-13 ENCOUNTER — Ambulatory Visit (INDEPENDENT_AMBULATORY_CARE_PROVIDER_SITE_OTHER): Payer: BLUE CROSS/BLUE SHIELD | Admitting: Psychiatry

## 2015-12-13 ENCOUNTER — Encounter (HOSPITAL_COMMUNITY): Payer: Self-pay | Admitting: Psychiatry

## 2015-12-13 VITALS — BP 106/68 | HR 76 | Ht 63.0 in | Wt 150.6 lb

## 2015-12-13 DIAGNOSIS — F988 Other specified behavioral and emotional disorders with onset usually occurring in childhood and adolescence: Secondary | ICD-10-CM

## 2015-12-13 DIAGNOSIS — F909 Attention-deficit hyperactivity disorder, unspecified type: Secondary | ICD-10-CM | POA: Diagnosis not present

## 2015-12-13 MED ORDER — DEXTROAMPHETAMINE SULFATE ER 15 MG PO CP24: ORAL_CAPSULE | ORAL | Status: DC

## 2015-12-13 MED ORDER — LAMOTRIGINE 150 MG PO TABS: 150.0000 mg | ORAL_TABLET | Freq: Two times a day (BID) | ORAL | Status: DC

## 2015-12-13 NOTE — Progress Notes (Signed)
[facility] Progress Note  Krysti Deist [id number] y.o.  12/13/2015 12:00 PM  Chief Complaint:  Medication management and followup.  History of Present Illness: Courtney Lee came for her followup appointment.  She had a very good Christmas.  His children came and visited them.  His son is now living in [place] daughter lives in [place].  Overall she describes her mood is stable.  Recently her primary care physician added vitamin D and she has noticed improvement in her energy level.  She sleeping good.  She denies any irritability, anger, mood swing.  Her attention and focus is good.  She has no tremors or shakes.  She has no rash or itching.  She denies any hallucination or any paranoia.  Her appetite is okay.  Her vitals are stable.  She denies drinking or using any illegal substances.  Patient lives with her husband and reported relationship is going very well.  Suicidal Ideation: No Plan Formed: No Patient has means to carry out plan: No  Homicidal Ideation: No Plan Formed: No Patient has means to carry out plan: No  Review of Systems: Psychiatric: Agitation: No Hallucination: No Depressed Mood: No Insomnia: No Hypersomnia: No Altered Concentration: No Feels Worthless: No Grandiose Ideas: No Belief In Special Powers: No New/Increased Substance Abuse: No Compulsions: No  Neurologic: Headache: Yes Seizure: No Paresthesias: No  Medical history. Patient has history of anemia, asthma and headaches.  Her primary care physician is Dr. Nehemiah Settle  Outpatient Encounter Prescriptions as of 12/13/2015  Medication Sig  . adapalene (DIFFERIN) 0.1 % gel   . dextroamphetamine (DEXEDRINE SPANSULE) 15 MG 24 hr capsule Take 2 in AM  . Ferrous Sulfate (IRON) 325 (65 FE) MG TABS Take 65 mg by mouth daily.  . FROVA 2.5 MG tablet   . lamoTRIgine (LAMICTAL) 150 MG tablet Take 1 tablet (150 mg total) by mouth 2 (two) times daily.  Marland Kitchen PROAIR HFA 108 (90 BASE)  MCG/ACT inhaler   . Vitamin D, Ergocalciferol, (DRISDOL) 50000 units CAPS capsule take 1 capsule by mouth every week for 12 WEEKS  . [DISCONTINUED] dextroamphetamine (DEXEDRINE SPANSULE) 15 MG 24 hr capsule Take 2 in AM  . [DISCONTINUED] dextroamphetamine (DEXEDRINE SPANSULE) 15 MG 24 hr capsule Take 2 in AM  . [DISCONTINUED] dextroamphetamine (DEXEDRINE SPANSULE) 15 MG 24 hr capsule Take 2 in AM  . [DISCONTINUED] lamoTRIgine (LAMICTAL) 150 MG tablet Take 1 tablet (150 mg total) by mouth 2 (two) times daily.   No facility-administered encounter medications on file as of 12/13/2015.    Past Psychiatric History/Hospitalization(s): Patient is a long history of depression and ADD. In the past she has taken Prozac Effexor however she had a good response with Lamictal and Dexedrine.  Anxiety: Yes Bipolar Disorder: No Depression: Yes Mania: No Psychosis: No Schizophrenia: No Personality Disorder: No Hospitalization for psychiatric illness: No History of Electroconvulsive Shock Therapy: No Prior Suicide Attempts: No  Physical Exam: Constitutional:  BP 106/68 mmHg  Pulse 76  Ht 5\' 3"  (1.6 m)  Wt 150 lb 9.6 oz (68.312 kg)  BMI 26.68 kg/m2  No results found for this or any previous visit (from the past 2160 hour(s)).  General Appearance: alert, oriented, no acute distress and well nourished  Musculoskeletal: Strength & Muscle Tone: within normal limits Gait & Station: normal Patient leans: N/A  Psychiatric Specialty Exam: Physical Exam  Review of Systems  Skin: Negative for itching and rash.  Neurological: Negative for dizziness, tremors and headaches.    Blood  pressure 106/68, pulse 76, height 5\' 3"  (1.6 m), weight 150 lb 9.6 oz (68.312 kg).Body mass index is 26.68 kg/(m^2).  General Appearance: Casual  Eye Contact::  Good  Speech:  Normal Rate  Volume:  Normal  Mood:  Euthymic  Affect:  Appropriate  Thought Process:  Coherent  Orientation:  Full (Time, Place, and Person)   Thought Content:  WDL  Suicidal Thoughts:  No  Homicidal Thoughts:  No  Memory:  Immediate;   Good Recent;   Good Remote;   Good  Judgement:  Good  Insight:  Good  Psychomotor Activity:  Normal  Concentration:  Good  Recall:  Good  Fund of Knowledge:  Good  Language:  Good  Akathisia:  No  Handed:  Right  AIMS (if indicated):     Assets:  Communication Skills Desire for Improvement Financial Resources/Insurance Housing Physical Health Social Support  ADL's:  Intact  Cognition:  WNL  Sleep:       Established Problem, Stable/Improving (1), Review of Last Therapy Session (1) and Review of Medication Regimen & Side Effects (2)  Assessment: Axis I: Depressive disorder NOS, attention deficit disorder  Axis II: Deferred  Axis III: See medical history  Plan:  Patient is a stable on her current psychiatric medication.  She has no side effects.  She like to get hard copy for her Lamictal and stimulant.  Discussed medication side effects and benefits.  Continue Lamictal 300 mg daily and Dexedrine 15 mg 2 tablet in the morning.  Recommended to call us back if she has any question or any concern.  Follow-up in 3 months.   ARFEEN,SYED T., MD 12/13/2015

## 2016-02-04 ENCOUNTER — Ambulatory Visit
Admission: RE | Admit: 2016-02-04 | Discharge: 2016-02-04 | Disposition: A | Discharge status: Home / Self Care | Payer: BLUE CROSS/BLUE SHIELD | Source: Ambulatory Visit | Attending: Internal Medicine | Admitting: Internal Medicine

## 2016-02-04 ENCOUNTER — Other Ambulatory Visit: Payer: Self-pay | Admitting: Internal Medicine

## 2016-02-04 DIAGNOSIS — R110 Nausea: Secondary | ICD-10-CM

## 2016-03-11 ENCOUNTER — Ambulatory Visit (HOSPITAL_COMMUNITY): Payer: Self-pay | Admitting: Psychiatry

## 2016-04-05 ENCOUNTER — Emergency Department (HOSPITAL_COMMUNITY): Payer: BLUE CROSS/BLUE SHIELD

## 2016-04-05 ENCOUNTER — Observation Stay (HOSPITAL_COMMUNITY)
Admission: EM | Admit: 2016-04-05 | Discharge: 2016-04-06 | Disposition: A | Discharge status: Home / Self Care | Payer: BLUE CROSS/BLUE SHIELD | Attending: Internal Medicine | Admitting: Internal Medicine

## 2016-04-05 ENCOUNTER — Encounter (HOSPITAL_COMMUNITY): Payer: Self-pay | Admitting: Emergency Medicine

## 2016-04-05 DIAGNOSIS — J45909 Unspecified asthma, uncomplicated: Secondary | ICD-10-CM | POA: Diagnosis present

## 2016-04-05 DIAGNOSIS — F909 Attention-deficit hyperactivity disorder, unspecified type: Secondary | ICD-10-CM | POA: Diagnosis present

## 2016-04-05 DIAGNOSIS — F32A Depression, unspecified: Secondary | ICD-10-CM | POA: Diagnosis present

## 2016-04-05 DIAGNOSIS — R079 Chest pain, unspecified: Secondary | ICD-10-CM | POA: Diagnosis present

## 2016-04-05 DIAGNOSIS — F329 Major depressive disorder, single episode, unspecified: Secondary | ICD-10-CM | POA: Diagnosis not present

## 2016-04-05 DIAGNOSIS — R071 Chest pain on breathing: Secondary | ICD-10-CM | POA: Diagnosis present

## 2016-04-05 DIAGNOSIS — Z79899 Other long term (current) drug therapy: Secondary | ICD-10-CM | POA: Insufficient documentation

## 2016-04-05 DIAGNOSIS — R634 Abnormal weight loss: Secondary | ICD-10-CM | POA: Diagnosis present

## 2016-04-05 HISTORY — DX: Depression, unspecified: F32.A

## 2016-04-05 HISTORY — DX: Major depressive disorder, single episode, unspecified: F32.9

## 2016-04-05 LAB — TROPONIN I
Troponin I: <0.03 ng/mL (ref <0.031)
Troponin I: <0.03 ng/mL (ref <0.031)

## 2016-04-05 LAB — CBC WITH DIFFERENTIAL/PLATELET
Basophils Absolute: 0.1 10*3/uL (ref 0.0–0.1)
Basophils Relative: 1 %
Eosinophils Absolute: 0.2 10*3/uL (ref 0.0–0.7)
Eosinophils Relative: 3 %
HEMATOCRIT: 40.5 % (ref 36.0–46.0)
HEMOGLOBIN: 13.1 g/dL (ref 12.0–15.0)
LYMPHS ABS: 1.5 10*3/uL (ref 0.7–4.0)
Lymphocytes Relative: 34 %
MCH: 29.8 pg (ref 26.0–34.0)
MCHC: 32.3 g/dL (ref 30.0–36.0)
MCV: 92 fL (ref 78.0–100.0)
MONOS PCT: 8 %
Monocytes Absolute: 0.4 10*3/uL (ref 0.1–1.0)
NEUTROS ABS: 2.5 10*3/uL (ref 1.7–7.7)
NEUTROS PCT: 54 %
Platelets: 299 10*3/uL (ref 150–400)
RBC: 4.4 MIL/uL (ref 3.87–5.11)
RDW: 13.7 % (ref 11.5–15.5)
WBC: 4.6 10*3/uL (ref 4.0–10.5)

## 2016-04-05 LAB — COMPREHENSIVE METABOLIC PANEL
ALK PHOS: 76 U/L (ref 38–126)
ALT: 33 U/L (ref 14–54)
ANION GAP: 7 (ref 5–15)
AST: 34 U/L (ref 15–41)
Albumin: 3.7 g/dL (ref 3.5–5.0)
BILIRUBIN TOTAL: 1 mg/dL (ref 0.3–1.2)
BUN: 14 mg/dL (ref 6–20)
CALCIUM: 9.3 mg/dL (ref 8.9–10.3)
CO2: 25 mmol/L (ref 22–32)
Chloride: 108 mmol/L (ref 101–111)
Creatinine, Ser: 0.73 mg/dL (ref 0.44–1.00)
GFR calc non Af Amer: >60 mL/min (ref >60)
Glucose, Bld: 94 mg/dL (ref 65–99)
Potassium: 4.3 mmol/L (ref 3.5–5.1)
Sodium: 140 mmol/L (ref 135–145)
TOTAL PROTEIN: 6.3 g/dL — AB (ref 6.5–8.1)

## 2016-04-05 LAB — LIPASE, BLOOD: Lipase: 30 U/L (ref 11–51)

## 2016-04-05 LAB — D-DIMER, QUANTITATIVE (NOT AT ARMC)

## 2016-04-05 MED ORDER — LAMOTRIGINE 100 MG PO TABS
150.0000 mg | ORAL_TABLET | Freq: Every day | ORAL | Status: DC
  Administered 2016-04-06: 150 mg via ORAL
  Filled 2016-04-05: qty 2

## 2016-04-05 MED ORDER — ONDANSETRON HCL 4 MG/2ML IJ SOLN: 4.0000 mg | Freq: Four times a day (QID) | INTRAMUSCULAR | Status: DC | PRN

## 2016-04-05 MED ORDER — ADULT MULTIVITAMIN W/MINERALS CH
1.0000 | ORAL_TABLET | Freq: Every day | ORAL | Status: DC
  Administered 2016-04-06: 1 via ORAL
  Filled 2016-04-05: qty 1

## 2016-04-05 MED ORDER — GI COCKTAIL ~~LOC~~
30.0000 mL | Freq: Once | ORAL | Status: AC
  Administered 2016-04-05: 30 mL via ORAL
  Filled 2016-04-05: qty 30

## 2016-04-05 MED ORDER — VITAMIN D3 125 MCG (5000 UT) PO TABS: 1.0000 | ORAL_TABLET | ORAL | Status: DC

## 2016-04-05 MED ORDER — FERROUS SULFATE 325 (65 FE) MG PO TABS
325.0000 mg | ORAL_TABLET | Freq: Every day | ORAL | Status: DC
  Administered 2016-04-06: 325 mg via ORAL
  Filled 2016-04-05: qty 1

## 2016-04-05 MED ORDER — IRON 325 (65 FE) MG PO TABS: 65.0000 mg | ORAL_TABLET | Freq: Every day | ORAL | Status: DC

## 2016-04-05 MED ORDER — ALBUTEROL SULFATE HFA 108 (90 BASE) MCG/ACT IN AERS: 1.0000 | INHALATION_SPRAY | RESPIRATORY_TRACT | Status: DC | PRN

## 2016-04-05 MED ORDER — ENOXAPARIN SODIUM 40 MG/0.4ML ~~LOC~~ SOLN
40.0000 mg | SUBCUTANEOUS | Status: DC
  Filled 2016-04-05: qty 0.4

## 2016-04-05 MED ORDER — ACETAMINOPHEN 325 MG PO TABS: 650.0000 mg | ORAL_TABLET | ORAL | Status: DC | PRN

## 2016-04-05 MED ORDER — IBUPROFEN 200 MG PO TABS
400.0000 mg | ORAL_TABLET | Freq: Four times a day (QID) | ORAL | Status: AC
  Administered 2016-04-05 – 2016-04-06 (×4): 400 mg via ORAL
  Filled 2016-04-05 (×4): qty 2

## 2016-04-05 MED ORDER — ALBUTEROL SULFATE (2.5 MG/3ML) 0.083% IN NEBU: 2.5000 mg | INHALATION_SOLUTION | RESPIRATORY_TRACT | Status: DC | PRN

## 2016-04-05 MED ORDER — ASPIRIN 81 MG PO CHEW
324.0000 mg | CHEWABLE_TABLET | Freq: Once | ORAL | Status: AC
  Administered 2016-04-05: 324 mg via ORAL
  Filled 2016-04-05: qty 4

## 2016-04-05 MED ORDER — NITROGLYCERIN 0.4 MG SL SUBL
0.4000 mg | SUBLINGUAL_TABLET | SUBLINGUAL | Status: DC | PRN
Start: 2016-04-05 — End: 2016-04-06

## 2016-04-05 MED ORDER — VITAMIN D 1000 UNITS PO TABS
5000.0000 [IU] | ORAL_TABLET | ORAL | Status: DC
  Administered 2016-04-06: 5000 [IU] via ORAL
  Filled 2016-04-05: qty 5

## 2016-04-05 MED ORDER — DEXTROAMPHETAMINE SULFATE ER 5 MG PO CP24
45.0000 mg | ORAL_CAPSULE | Freq: Every day | ORAL | Status: DC
  Filled 2016-04-05: qty 9

## 2016-04-05 NOTE — ED Notes (Signed)
Pt states at first hard to swollow after gi cocktail but better now

## 2016-04-05 NOTE — ED Notes (Signed)
Report given to rn on 3000

## 2016-04-05 NOTE — H&P (Signed)
History and Physical    Courtney Lee [id number] DOB: [birth year] DOA: 04/05/2016   PCP: Carlena Sax, MD   Patient coming from/Resides with: Private residence/lives with husband  Chief Complaint: Right arm and chest pain  HPI: Courtney Lee is a 55 y.o. female with medical history significant for ADHD on stimulant medications, depression followed by psychiatry and on medications as well and history of dextrocardia presents to the ER after developing right arm pain last night. She states late yesterday evening she developed right arm discomfort in her hands feel cold in her arm felt "weird". The pain decreased somewhat and patient proceeded to running on the treadmill per her usual nocturnal routine but was only able to run about 3 miles and felt more tired than usual. No matter how she positioned her right arm she could not get comfortable and the pain would not subside. She finally fell asleep and awakened this morning apparently asymptomatic but then when she took deep breaths she noticed she was having right-sided chest discomfort primarily with taking deep breath. This was not associated with a cough. Because of the chest tightness she thought she may be experiencing asthma symptoms therefore utilized her inhaler without relief. Because symptoms persisted she presented to the ER for further evaluation and treatment.  ED Course:  PO 97.6-BP 117 over a state-pulse 70-respirations 18-room air saturations 100% 2 view chest x-ray: No active cardiac pulmonary disease Lab data: Sodium 140, potassium 4.3, BUN 14, creatinine 0.73, LFTs normal except for total protein 6.3, troponin less than 0.032 collections, WBCs 4600 with normal differential, hemoglobin 13.1, platelets 299,000; d-dimer less than 0.27, glucose 94 Aspirin 324 milligrams by mouth 1 GI cocktail 1  Review of Systems:  In addition to the HPI above,  No Fever-chills, myalgias or other constitutional  symptoms No Headache, changes with Vision or hearing, new weakness, tingling, numbness in any extremity, No problems swallowing food or Liquids, indigestion/reflux No Chest pain, Cough or Shortness of Breath, palpitations, orthopnea or DOE No Abdominal pain, N/V; no melena or hematochezia, no dark tarry stools, Bowel movements are regular, No dysuria, hematuria or flank pain No new skin rashes, lesions, masses or bruises, No new joints pains-aches **See below regarding nonintentional weight loss being worked up by primary care physician No polyuria, polydypsia or polyphagia,   Past Medical History  Diagnosis Date  . Asthma   . Abnormal Pap smear early 20's  . Headache(784.0)   . Depression     Past Surgical History  Procedure Laterality Date  . Cesarean section    . Tubal ligation    . Gynecologic cryosurgery    . Leep    . Wisdom tooth extraction    . Carpal tunnel release  2003    right hand     reports that she has never smoked. She has never used smokeless tobacco. She reports that she does not drink alcohol or use illicit drugs.  Mobility: Without assistive devices Work history: Did not obtain   Allergies  Allergen Reactions  . Codeine Other (See Comments)    unk reaction  . Erythromycin Other (See Comments)    unk reaction  . Gluten Meal Other (See Comments)    migraines    Family History  Problem Relation Age of Onset  . Hypertension    . Uterine cancer      Great MGM  . Stomach cancer Maternal Grandmother      Prior to Admission medications   Medication Sig Start Date End Date  Taking? Authorizing Provider  adapalene (DIFFERIN) 0.1 % gel Apply 1 application topically at bedtime as needed.  02/22/15  Yes Historical Provider, MD  Cholecalciferol (VITAMIN D3) 5000 units TABS Take 1 tablet by mouth every Monday, Wednesday, and Friday.   Yes Historical Provider, MD  dextroamphetamine (DEXEDRINE SPANSULE) 15 MG 24 hr capsule Take 2 in AM Patient taking  differently: Take 45 mg by mouth daily. Take 2 in AM 12/13/15  Yes Kathlee Nations, MD  Ferrous Sulfate (IRON) 325 (65 FE) MG TABS Take 65 mg by mouth daily.   Yes Historical Provider, MD  lamoTRIgine (LAMICTAL) 150 MG tablet Take 1 tablet (150 mg total) by mouth 2 (two) times daily. Patient taking differently: Take 150 mg by mouth daily.  12/13/15  Yes Kathlee Nations, MD  Multiple Vitamin (MULTIVITAMIN WITH MINERALS) TABS tablet Take 1 tablet by mouth daily.   Yes Historical Provider, MD  OVER THE COUNTER MEDICATION Take 1 capsule by mouth daily. Tumeric w/black pepper   Yes Historical Provider, MD  PROAIR HFA 108 (90 BASE) MCG/ACT inhaler Inhale 1 puff into the lungs every 4 (four) hours as needed for wheezing.  01/15/15  Yes Historical Provider, MD  FROVA 2.5 MG tablet Take 2.5 mg by mouth as needed for migraine.  01/16/15   Historical Provider, MD  Vitamin D, Ergocalciferol, (DRISDOL) 50000 units CAPS capsule take 1 capsule by mouth every week for 12 WEEKS 12/05/15   Historical Provider, MD    Physical Exam: Filed Vitals:   04/05/16 1119 04/05/16 1130 04/05/16 1145 04/05/16 1200  BP: 129/73 121/89 132/89 117/82  Pulse: 72 73 76 70  Temp: 97.6 F (36.4 C)     TempSrc: Oral     Resp: 18 13 15 10   SpO2: 100% 100% 100% 100%      Constitutional: NAD, calm, comfortable Eyes: PERRL, lids and conjunctivae normal ENMT: Mucous membranes are moist. Posterior pharynx clear of any exudate or lesions.Normal dentition.  Neck: normal, supple, no masses, no thyromegaly Respiratory: clear to auscultation bilaterally, no wheezing, no crackles. Normal respiratory effort. No accessory muscle use.  Cardiovascular: Regular rate and rhythm, (**focused auscultation right chest since has dextrocardia) no murmurs / rubs / gallops. No extremity edema. 2+ pedal pulses. No carotid bruits.  Abdomen: no tenderness, no masses palpated. No hepatosplenomegaly. Bowel sounds positive.  Musculoskeletal: no clubbing / cyanosis. No  joint deformity upper and lower extremities. Good ROM, no contractures. Normal muscle tone.  Skin: no rashes, lesions, ulcers. No induration Neurologic: CN 2-12 grossly intact. Sensation intact, DTR normal. Strength 5/5 x all 4 extremities.  Psychiatric: Normal judgment and insight. Alert and oriented x 3. Normal mood.    Labs on Admission: I have personally reviewed following labs and imaging studies  CBC:  Recent Labs Lab 04/05/16 1116  WBC 4.6  NEUTROABS 2.5  HGB 13.1  HCT 40.5  MCV 92.0  PLT 123XX123   Basic Metabolic Panel:  Recent Labs Lab 04/05/16 1116  NA 140  K 4.3  CL 108  CO2 25  GLUCOSE 94  BUN 14  CREATININE 0.73  CALCIUM 9.3   GFR: CrCl cannot be calculated (Unknown ideal weight.). Liver Function Tests:  Recent Labs Lab 04/05/16 1116  AST 34  ALT 33  ALKPHOS 76  BILITOT 1.0  PROT 6.3*  ALBUMIN 3.7    Recent Labs Lab 04/05/16 1116  LIPASE 30   No results for input(s): AMMONIA in the last 168 hours. Coagulation Profile: No results for input(s):  INR, PROTIME in the last 168 hours. Cardiac Enzymes:  Recent Labs Lab 04/05/16 1116  TROPONINI <0.03   BNP (last 3 results) No results for input(s): PROBNP in the last 8760 hours. HbA1C: No results for input(s): HGBA1C in the last 72 hours. CBG: No results for input(s): GLUCAP in the last 168 hours. Lipid Profile: No results for input(s): CHOL, HDL, LDLCALC, TRIG, CHOLHDL, LDLDIRECT in the last 72 hours. Thyroid Function Tests: No results for input(s): TSH, T4TOTAL, FREET4, T3FREE, THYROIDAB in the last 72 hours. Anemia Panel: No results for input(s): VITAMINB12, FOLATE, FERRITIN, TIBC, IRON, RETICCTPCT in the last 72 hours. Urine analysis: No results found for: COLORURINE, APPEARANCEUR, LABSPEC, PHURINE, GLUCOSEU, HGBUR, BILIRUBINUR, KETONESUR, PROTEINUR, UROBILINOGEN, NITRITE, LEUKOCYTESUR Sepsis Labs: @LABRCNTIP (procalcitonin:4,lacticidven:4) )No results found for this or any previous  visit (from the past 240 hour(s)).   Radiological Exams on Admission: Dg Chest 2 View  04/05/2016  CLINICAL DATA:  Patient with chest pain and right arm pain. EXAM: CHEST  2 VIEW COMPARISON:  Chest CT 07/20/2013 FINDINGS: Stable cardiac and mediastinal contours. No consolidative pulmonary opacities. No pleural effusion or pneumothorax. Mid thoracic spine degenerative changes. IMPRESSION: No active cardiopulmonary disease. Electronically Signed   By: Lovey Newcomer M.D.   On: 04/05/2016 12:50   Dg Cervical Spine Complete  04/05/2016  CLINICAL DATA:  55 year old female with history of chest pain and tightness this morning, and right-sided arm pain yesterday evening. EXAM: CERVICAL SPINE - COMPLETE 4+ VIEW COMPARISON:  No priors. FINDINGS: Five views of the cervical spine demonstrate no acute displaced fracture. Alignment is anatomic. Prevertebral soft tissues are normal. Multilevel degenerative disc disease, most severe at C5-C6. Multilevel facet arthropathy. IMPRESSION: 1. No acute radiographic abnormality of the cervical spine. 2. Multilevel degenerative disc disease and cervical spondylosis, as above. Electronically Signed   By: Vinnie Langton M.D.   On: 04/05/2016 14:32    EKG: (Independently reviewed) **right side EKG in patient with known dextrocardia: Sinus rhythm with a ventricular rate of 73 bpm, QTC 440 ms, normal R-wave progression, low voltage R waves in septal lateral leads, no obvious ST segment changes or T-wave changes concerning for ischemia  Assessment/Plan Principal Problem:   Chest pain -Patient presents with right arm and chest pain with associated mild exercise intolerance with a heart score of 3 **patient has dextrocardia** -2 negative troponins as well as normal EKG reassuring -Check an echocardiogram -Case was discussed with Dr.Koneswaran of cardiology by EDP who stated he would try to arrange for outpatient stress test and agreed with admission for following  troponins -Patient offered option of inpatient stress test versus if no other indication to pursue testing while inpatient can obtain stress test as an outpatient-I have made patient nothing by mouth after midnight in the event inpatient stress test opted for -Patient having chest pain with respiratory effort so pericarditis and differential therefore NSAIDs added  Active Problems:   Asthma -Currently not actively wheezing but will continue preadmission inhalers    ADHD (attention deficit hyperactivity disorder) -Continue preadmission Dexedrine    Depression -Continue preadmission Lamictal    Unintentional weight loss -Patient reports that over about a 3-6 months. She's lost about 40 pounds; this was unintentional and related to chronic nausea and lack of oral intake. She reports that since that time her nausea has improved and her weight is increased slightly -She has an outpatient appointment with Dr. Wynetta Emery of gastroenterology for further evaluation -An abdominal ultrasound in March was unrevealing      DVT  prophylaxis: Lovenox Code Status: Full code Family Communication: Husband and son at bedside Disposition Plan: Anticipate discharge back to preadmission home environment when medically stable Consults called: Cardiology curb sided by EDP with recommendations as above Admission status: Observation/telemetry     Gerrad Welker L. ANP-BC [facility] Pager [phone]   If 7PM-7AM, please contact night-coverage [email or web address] Password [email or web address]  04/05/2016, 4:11 PM

## 2016-04-05 NOTE — ED Notes (Signed)
Back from xray

## 2016-04-05 NOTE — ED Notes (Signed)
Admitting doctor at the bedside 

## 2016-04-05 NOTE — ED Notes (Signed)
Pt states that she started to have chest   Pain/ tightness this am but had rt arm pain last night . States that she took puffs of her inhaler but it did not help , states has no heart hx, occ has fast heart rate

## 2016-04-05 NOTE — Progress Notes (Signed)
Pt reports tingling in RUE and RLE,, MD aware of symptoms, no new orders added, will continue to monitor closely.  Edward Qualia RN

## 2016-04-05 NOTE — ED Notes (Signed)
Pt c/o rt arm pain just returned from c-t  1000cc nss infused.

## 2016-04-05 NOTE — ED Provider Notes (Signed)
CSN: [id number]     Arrival date & time 04/05/16  1049 History   First MD Initiated Contact with Patient 04/05/16 1100     Chief Complaint  Patient presents with  . Chest Pain     (Consider location/radiation/quality/duration/timing/severity/associated sxs/prior Treatment) HPI Comments: Patient presents with episode of chest tightness that started today when she woke from sleep at 7 AM. It is constant. It is worse with deep breathing. It radiates to R arm. Last night she developed right arm pain in the evening. This pain in her arm started while she was running. She normally runs 4 miles 4 times a week without a problem. She states she had sudden onset of a "cold feeling" in her right arm with pain and numbness. This persisted and is still somewhat present this morning with the chest pain. She denies shortness of breath, nausea, vomiting or diaphoresis. She could not complete her run yesterday because of fatigue and the arm pain. Denies any history of heart problems. Has never had a stress test. Does not smoke. No history of hypertension, diabetes, hyperlipidemia.  The history is provided by the patient and a relative.    Past Medical History  Diagnosis Date  . Asthma   . Abnormal Pap smear early 20's  . Headache(784.0)   . Depression    Past Surgical History  Procedure Laterality Date  . Cesarean section    . Tubal ligation    . Gynecologic cryosurgery    . Leep    . Wisdom tooth extraction    . Carpal tunnel release  2003    right hand   Family History  Problem Relation Age of Onset  . Hypertension    . Uterine cancer      Great MGM  . Stomach cancer Maternal Grandmother    Social History  Substance Use Topics  . Smoking status: Never Smoker   . Smokeless tobacco: Never Used  . Alcohol Use: No   OB History    Gravida Para Term Preterm AB TAB SAB Ectopic Multiple Living   3 3        3      Review of Systems  Constitutional: Negative for fever, activity change,  appetite change and fatigue.  HENT: Negative for congestion.   Eyes: Negative for visual disturbance.  Respiratory: Positive for shortness of breath. Negative for cough and chest tightness.   Cardiovascular: Positive for chest pain. Negative for palpitations.  Gastrointestinal: Negative for nausea, vomiting and abdominal pain.  Genitourinary: Negative for dysuria, hematuria, vaginal bleeding and vaginal discharge.  Musculoskeletal: Negative for joint swelling and arthralgias.  Skin: Negative for rash.  Neurological: Positive for numbness. Negative for dizziness, weakness, light-headedness and headaches.  A complete 10 system review of systems was obtained and all systems are negative except as noted in the HPI and PMH.      Allergies  Codeine; Erythromycin; and Gluten meal  Home Medications   Prior to Admission medications   Medication Sig Start Date End Date Taking? Authorizing Provider  adapalene (DIFFERIN) 0.1 % gel Apply 1 application topically at bedtime as needed.  02/22/15  Yes Historical Provider, MD  Cholecalciferol (VITAMIN D3) 5000 units TABS Take 1 tablet by mouth every Monday, Wednesday, and Friday.   Yes Historical Provider, MD  dextroamphetamine (DEXEDRINE SPANSULE) 15 MG 24 hr capsule Take 2 in AM Patient taking differently: Take 45 mg by mouth daily. Take 2 in AM 12/13/15  Yes Kathlee Nations, MD  Ferrous Sulfate (IRON)  325 (65 FE) MG TABS Take 65 mg by mouth daily.   Yes Historical Provider, MD  lamoTRIgine (LAMICTAL) 150 MG tablet Take 1 tablet (150 mg total) by mouth 2 (two) times daily. Patient taking differently: Take 150 mg by mouth daily.  12/13/15  Yes Kathlee Nations, MD  Multiple Vitamin (MULTIVITAMIN WITH MINERALS) TABS tablet Take 1 tablet by mouth daily.   Yes Historical Provider, MD  OVER THE COUNTER MEDICATION Take 1 capsule by mouth daily. Tumeric w/black pepper   Yes Historical Provider, MD  PROAIR HFA 108 (90 BASE) MCG/ACT inhaler Inhale 1 puff into the lungs  every 4 (four) hours as needed for wheezing.  01/15/15  Yes Historical Provider, MD  FROVA 2.5 MG tablet Take 2.5 mg by mouth as needed for migraine.  01/16/15   Historical Provider, MD  Vitamin D, Ergocalciferol, (DRISDOL) 50000 units CAPS capsule take 1 capsule by mouth every week for 12 WEEKS 12/05/15   Historical Provider, MD   BP 117/82 mmHg  Pulse 70  Temp(Src) 97.6 F (36.4 C) (Oral)  Resp 10  SpO2 100%  LMP 02/15/2014 Physical Exam  Constitutional: She is oriented to person, place, and time. She appears well-developed and well-nourished. No distress.  HENT:  Head: Normocephalic and atraumatic.  Mouth/Throat: Oropharynx is clear and moist. No oropharyngeal exudate.  Eyes: Conjunctivae and EOM are normal. Pupils are equal, round, and reactive to light.  Neck: Normal range of motion. Neck supple.  No meningismus.  Cardiovascular: Normal rate, regular rhythm, normal heart sounds and intact distal pulses.   No murmur heard. Pulmonary/Chest: Effort normal and breath sounds normal. No respiratory distress. She exhibits no tenderness.  Abdominal: Soft. There is no tenderness. There is no rebound and no guarding.  Musculoskeletal: Normal range of motion. She exhibits no edema or tenderness.  Neurological: She is alert and oriented to person, place, and time. No cranial nerve deficit. She exhibits normal muscle tone. Coordination normal.  No ataxia on finger to nose bilaterally. No pronator drift. 5/5 strength throughout. CN 2-12 intact.Equal grip strength. Sensation intact.   Skin: Skin is warm.  Psychiatric: She has a normal mood and affect. Her behavior is normal.  Nursing note and vitals reviewed.   ED Course  Procedures (including critical care time) Labs Review Labs Reviewed  COMPREHENSIVE METABOLIC PANEL - Abnormal; Notable for the following:    Total Protein 6.3 (*)    All other components within normal limits  CBC WITH DIFFERENTIAL/PLATELET  LIPASE, BLOOD  TROPONIN I   D-DIMER, QUANTITATIVE (NOT AT [facility])    Imaging Review Dg Chest 2 View  04/05/2016  CLINICAL DATA:  Patient with chest pain and right arm pain. EXAM: CHEST  2 VIEW COMPARISON:  Chest CT 07/20/2013 FINDINGS: Stable cardiac and mediastinal contours. No consolidative pulmonary opacities. No pleural effusion or pneumothorax. Mid thoracic spine degenerative changes. IMPRESSION: No active cardiopulmonary disease. Electronically Signed   By: Lovey Newcomer M.D.   On: 04/05/2016 12:50   Dg Cervical Spine Complete  04/05/2016  CLINICAL DATA:  55 year old female with history of chest pain and tightness this morning, and right-sided arm pain yesterday evening. EXAM: CERVICAL SPINE - COMPLETE 4+ VIEW COMPARISON:  No priors. FINDINGS: Five views of the cervical spine demonstrate no acute displaced fracture. Alignment is anatomic. Prevertebral soft tissues are normal. Multilevel degenerative disc disease, most severe at C5-C6. Multilevel facet arthropathy. IMPRESSION: 1. No acute radiographic abnormality of the cervical spine. 2. Multilevel degenerative disc disease and cervical spondylosis, as  above. Electronically Signed   By: Vinnie Langton M.D.   On: 04/05/2016 14:32   I have personally reviewed and evaluated these images and lab results as part of my medical decision-making.   EKG Interpretation   Date/Time:  Sunday Apr 05 2016 11:00:52 EDT Ventricular Rate:  73 PR Interval:  145 QRS Duration: 90 QT Interval:  389 QTC Calculation: 429 R Axis:   74 Text Interpretation:  Sinus rhythm LAE, consider biatrial enlargement  Anteroseptal infarct, age indeterminate RBBB resolved Confirmed by Kaegan Hettich   MD, Marykate Heuberger [id number]) on 04/05/2016 11:19:56 AM      MDM   Final diagnoses:  Chest pain, unspecified chest pain type   Chest tightness since this morning. Worse with deep breathing. Ongoing right arm discomfort since last night causing her not to be able to do her usual running. EKG shows normal sinus  rhythm, no acute ST changes.  Troponin negative. D-dimer negative.  Heart score is 3. Patient with no previous cardiac history. Concerning that she developed symptoms with exertion yesterday.  Patient noted to have dextrocardia. She is aware of this. Right-sided EKG will be obtained  Case is concerning due to patient's pain with exertion. However her risk factors are minimal. Discussed with Dr. Bronson Ing of cardiology. He will try to arrange for outpatient stress and agrees with second troponin.  Troponin negative x2.  Patient concerned with going home because she lives an hour away. She wishes to stay for rule out and possible inpatient stress test.  Observation admission discussed. With NP Lissa Merlin.   Ezequiel Essex, MD 04/05/16 2228

## 2016-04-06 DIAGNOSIS — Z79899 Other long term (current) drug therapy: Secondary | ICD-10-CM | POA: Diagnosis not present

## 2016-04-06 DIAGNOSIS — R071 Chest pain on breathing: Secondary | ICD-10-CM | POA: Diagnosis not present

## 2016-04-06 DIAGNOSIS — F329 Major depressive disorder, single episode, unspecified: Secondary | ICD-10-CM | POA: Diagnosis not present

## 2016-04-06 DIAGNOSIS — J45909 Unspecified asthma, uncomplicated: Secondary | ICD-10-CM | POA: Diagnosis not present

## 2016-04-06 LAB — TROPONIN I

## 2016-04-06 LAB — LIPID PANEL
CHOLESTEROL: 121 mg/dL (ref 0–200)
HDL: 62 mg/dL (ref >40)
LDL Cholesterol: 52 mg/dL (ref 0–99)
TRIGLYCERIDES: 36 mg/dL (ref <150)
Total CHOL/HDL Ratio: 2 RATIO
VLDL: 7 mg/dL (ref 0–40)

## 2016-04-06 MED ORDER — DEXTROAMPHETAMINE SULFATE ER 5 MG PO CP24
30.0000 mg | ORAL_CAPSULE | Freq: Every day | ORAL | Status: DC
  Administered 2016-04-06: 30 mg via ORAL
  Filled 2016-04-06: qty 6

## 2016-04-07 NOTE — Discharge Summary (Signed)
Physician Discharge Summary  Courtney Lee [id number] DOB: [birth year] DOA: 04/05/2016  PCP: Carlena Sax, MD  Admit date: 04/05/2016 Discharge date: 04/06/2016  Time spent: 25 minutes  Recommendations for Outpatient Follow-up:  1. Follow upwith PCP in one week.  2. Follow up with cardiology, their office will you for appt.    Discharge Diagnoses:  Principal Problem:   Chest pain Active Problems:   ADHD (attention deficit hyperactivity disorder)   Depression   Unintentional weight loss   Asthma   Discharge Condition: improved  Diet recommendation: regular  Filed Weights   04/05/16 1650 04/06/16 0538  Weight: 61.553 kg (135 lb 11.2 oz) 60.918 kg (134 lb 4.8 oz)    History of present illness/Hospital Course:    55 yo F with dextrocardia, came in with chest discomfort on and off for past couple of days, associated with some chest pain worsened with deep inspiration. She was admitted for evaluation of ACS and it was ruled out with negative troponins and EKG showing NSR. Cardiology consulted by EDP recommended outpatient stress test after ACS is ruled out.  She is currently chest pain free and would like to go home. Recommended getting an echocardiogram before discharge, but she did not want to stay another night in the hospital for the echocardiogram and would like it to be done as outpatient .     Procedures:  NONE  Consultations:  Cardiology in ED.   Discharge Exam: Filed Vitals:   04/06/16 0537 04/06/16 1259  BP: 112/71 99/52  Pulse: 73 78  Temp: 97.7 F (36.5 C) 98.3 F (36.8 C)  Resp: 17 18    General: alert comfortable. Cardiovascular: s1s2 Respiratory: ctab.  Discharge Instructions   Discharge Instructions    Diet general    Complete by:  As directed      Discharge instructions    Complete by:  As directed   Will need outpatient follow up with cardiology for outpatient stress test. We will call for the appointment.           Discharge Medication List as of 04/06/2016  5:45 PM    CONTINUE these medications which have NOT CHANGED   Details  adapalene (DIFFERIN) 0.1 % gel Apply 1 application topically at bedtime as needed. , Starting 02/22/2015, Until Discontinued, Historical Med    Cholecalciferol (VITAMIN D3) 5000 units TABS Take 1 tablet by mouth every Monday, Wednesday, and Friday., Until Discontinued, Historical Med    dextroamphetamine (DEXEDRINE SPANSULE) 15 MG 24 hr capsule Take 2 in AM, Print    Ferrous Sulfate (IRON) 325 (65 FE) MG TABS Take 65 mg by mouth daily., Until Discontinued, Historical Med    lamoTRIgine (LAMICTAL) 150 MG tablet Take 1 tablet (150 mg total) by mouth 2 (two) times daily., Starting 12/13/2015, Until Discontinued, Print    Multiple Vitamin (MULTIVITAMIN WITH MINERALS) TABS tablet Take 1 tablet by mouth daily., Until Discontinued, Historical Med    OVER THE COUNTER MEDICATION Take 1 capsule by mouth daily. Tumeric w/black pepper, Until Discontinued, Historical Med    PROAIR HFA 108 (90 BASE) MCG/ACT inhaler Inhale 1 puff into the lungs every 4 (four) hours as needed for wheezing. , Starting 01/15/2015, Until Discontinued, Historical Med    FROVA 2.5 MG tablet Take 2.5 mg by mouth as needed for migraine. , Starting 01/16/2015, Until Discontinued, Historical Med    Vitamin D, Ergocalciferol, (DRISDOL) 50000 units CAPS capsule take 1 capsule by mouth every week for 12 WEEKS, Historical Med  Allergies  Allergen Reactions  . Codeine Other (See Comments)    unk reaction  . Erythromycin Other (See Comments)    unk reaction  . Gluten Meal Other (See Comments)    migraines   Follow-up Information    Follow up with Carlena Sax, MD.   Specialty:  Internal Medicine   Contact information:   [phone] [address] [place] [address] [phone]        The results of significant diagnostics from this hospitalization (including imaging, microbiology, ancillary and  laboratory) are listed below for reference.    Significant Diagnostic Studies: Dg Chest 2 View  04/05/2016  CLINICAL DATA:  Patient with chest pain and right arm pain. EXAM: CHEST  2 VIEW COMPARISON:  Chest CT 07/20/2013 FINDINGS: Stable cardiac and mediastinal contours. No consolidative pulmonary opacities. No pleural effusion or pneumothorax. Mid thoracic spine degenerative changes. IMPRESSION: No active cardiopulmonary disease. Electronically Signed   By: Lovey Newcomer M.D.   On: 04/05/2016 12:50   Dg Cervical Spine Complete  04/05/2016  CLINICAL DATA:  56 year old female with history of chest pain and tightness this morning, and right-sided arm pain yesterday evening. EXAM: CERVICAL SPINE - COMPLETE 4+ VIEW COMPARISON:  No priors. FINDINGS: Five views of the cervical spine demonstrate no acute displaced fracture. Alignment is anatomic. Prevertebral soft tissues are normal. Multilevel degenerative disc disease, most severe at C5-C6. Multilevel facet arthropathy. IMPRESSION: 1. No acute radiographic abnormality of the cervical spine. 2. Multilevel degenerative disc disease and cervical spondylosis, as above. Electronically Signed   By: Vinnie Langton M.D.   On: 04/05/2016 14:32    Microbiology: No results found for this or any previous visit (from the past 240 hour(s)).   Labs: Basic Metabolic Panel:  Recent Labs Lab 04/05/16 1116  NA 140  K 4.3  CL 108  CO2 25  GLUCOSE 94  BUN 14  CREATININE 0.73  CALCIUM 9.3   Liver Function Tests:  Recent Labs Lab 04/05/16 1116  AST 34  ALT 33  ALKPHOS 76  BILITOT 1.0  PROT 6.3*  ALBUMIN 3.7    Recent Labs Lab 04/05/16 1116  LIPASE 30   No results for input(s): AMMONIA in the last 168 hours. CBC:  Recent Labs Lab 04/05/16 1116  WBC 4.6  NEUTROABS 2.5  HGB 13.1  HCT 40.5  MCV 92.0  PLT 299   Cardiac Enzymes:  Recent Labs Lab 04/05/16 1116 04/05/16 1525 04/05/16 1701 04/05/16 2230 04/06/16 0515  TROPONINI <0.03  <0.03 <0.03 <0.03 <0.03   BNP: BNP (last 3 results) No results for input(s): BNP in the last 8760 hours.  ProBNP (last 3 results) No results for input(s): PROBNP in the last 8760 hours.  CBG: No results for input(s): GLUCAP in the last 168 hours.     SignedHosie Poisson MD.  [facility] 04/07/2016, 9:13 AM

## 2016-04-14 ENCOUNTER — Encounter (HOSPITAL_COMMUNITY): Payer: Self-pay | Admitting: Psychiatry

## 2016-04-14 ENCOUNTER — Ambulatory Visit (INDEPENDENT_AMBULATORY_CARE_PROVIDER_SITE_OTHER): Payer: BLUE CROSS/BLUE SHIELD | Admitting: Psychiatry

## 2016-04-14 VITALS — BP 110/72 | HR 84 | Ht 63.0 in | Wt 132.0 lb

## 2016-04-14 DIAGNOSIS — F329 Major depressive disorder, single episode, unspecified: Secondary | ICD-10-CM

## 2016-04-14 DIAGNOSIS — F909 Attention-deficit hyperactivity disorder, unspecified type: Secondary | ICD-10-CM | POA: Diagnosis not present

## 2016-04-14 DIAGNOSIS — F988 Other specified behavioral and emotional disorders with onset usually occurring in childhood and adolescence: Secondary | ICD-10-CM

## 2016-04-14 MED ORDER — DEXTROAMPHETAMINE SULFATE ER 15 MG PO CP24: ORAL_CAPSULE | ORAL | Status: DC

## 2016-04-14 MED ORDER — LAMOTRIGINE 100 MG PO TABS: 100.0000 mg | ORAL_TABLET | Freq: Two times a day (BID) | ORAL | Status: DC

## 2016-04-14 NOTE — Progress Notes (Signed)
[facility] [id number] Progress Note  Sheriden Archibeque [id number] 55 y.o.  04/14/2016 3:56 PM  Chief Complaint:  I am losing weight.  I'm scheduled to have colonoscopy next month.  I was admitted briefly because of chest pain.    History of Present Illness: Lasasha came for her followup appointment.  She has lost 20 pounds in past 3 months.  She started noticing getting sick when she returned from [place] after visiting her family.  She mentioned she had to put her dog on May 6 because he was sick and started having kidney issues.  She was sad about that but denies any feeling of hopelessness.  On Memorial Day she had a chest pain and she went to the emergency room where she has extensive cardiac workup.  Her enzymes were normal.  Patient is worried about unexplained weight loss but overall she described her energy level is good.  She continues to run 5 miles a day.  She was given anti-inflammatory which helps.  She was scheduled to see stress test but her primary care physician Dr. Inda Merlin recommended that she is doing fine and at this time does not need any stress test.  Patient is scheduled to have colonoscopy.  She feels anxious about her physical illness but otherwise she reported no side effects.  She admitted some time having memory problem which she believed due to Lamictal.  She like to cut down the dose.  Patient denies any stress at home.  She enjoys making her children.  She denies any irritability, agitation, anger, paranoia or any hallucination.  She has no tremors or shakes.  Her sleep is good.  She is taking Dexedrine 30 mg in the morning and reported no side effects.  Patient denies drinking alcohol or using any illegal substances. Patient lives with her husband and reported relationship is going very well.  Suicidal Ideation: No Plan Formed: No Patient has means to carry out plan: No  Homicidal Ideation: No Plan Formed: No Patient has means to carry out plan:  No  Review of Systems: Psychiatric: Agitation: No Hallucination: No Depressed Mood: No Insomnia: No Hypersomnia: No Altered Concentration: No Feels Worthless: No Grandiose Ideas: No Belief In Special Powers: No New/Increased Substance Abuse: No Compulsions: No  Neurologic: Headache: Yes Seizure: No Paresthesias: No  Medical history. Patient has history of anemia, asthma and headaches.  Her primary care physician is Dr. Nehemiah Settle  Outpatient Encounter Prescriptions as of 04/14/2016  Medication Sig  . adapalene (DIFFERIN) 0.1 % gel Apply 1 application topically at bedtime as needed.   . Cholecalciferol (VITAMIN D3) 5000 units TABS Take 1 tablet by mouth every Monday, Wednesday, and Friday.  Marland Kitchen dextroamphetamine (DEXEDRINE SPANSULE) 15 MG 24 hr capsule Take 2 in AM  . Ferrous Sulfate (IRON) 325 (65 FE) MG TABS Take 65 mg by mouth daily.  . FROVA 2.5 MG tablet Take 2.5 mg by mouth as needed for migraine.   . lamoTRIgine (LAMICTAL) 100 MG tablet Take 1 tablet (100 mg total) by mouth 2 (two) times daily.  . Multiple Vitamin (MULTIVITAMIN WITH MINERALS) TABS tablet Take 1 tablet by mouth daily.  Marland Kitchen OVER THE COUNTER MEDICATION Take 1 capsule by mouth daily. Tumeric w/black pepper  . PROAIR HFA 108 (90 BASE) MCG/ACT inhaler Inhale 1 puff into the lungs every 4 (four) hours as needed for wheezing.   . Vitamin D, Ergocalciferol, (DRISDOL) 50000 units CAPS capsule take 1 capsule by mouth every week for 12 WEEKS  . [  DISCONTINUED] dextroamphetamine (DEXEDRINE SPANSULE) 15 MG 24 hr capsule Take 2 in AM (Patient taking differently: Take 30 mg by mouth daily. Take 2 in AM)  . [DISCONTINUED] dextroamphetamine (DEXEDRINE SPANSULE) 15 MG 24 hr capsule Take 2 in AM  . [DISCONTINUED] dextroamphetamine (DEXEDRINE SPANSULE) 15 MG 24 hr capsule Take 2 in AM  . [DISCONTINUED] lamoTRIgine (LAMICTAL) 150 MG tablet Take 1 tablet (150 mg total) by mouth 2 (two) times daily. (Patient taking differently: Take  150 mg by mouth daily. )   No facility-administered encounter medications on file as of 04/14/2016.    Past Psychiatric History/Hospitalization(s): Patient is a long history of depression and ADD. In the past she has taken Prozac Effexor however she had a good response with Lamictal and Dexedrine.  Anxiety: Yes Bipolar Disorder: No Depression: Yes Mania: No Psychosis: No Schizophrenia: No Personality Disorder: No Hospitalization for psychiatric illness: No History of Electroconvulsive Shock Therapy: No Prior Suicide Attempts: No  Physical Exam: Constitutional:  BP 110/72 mmHg  Pulse 84  Ht '5\' 3"'  (1.6 m)  Wt 132 lb (59.875 kg)  BMI 23.39 kg/m2  LMP 02/15/2014  Recent Results (from the past 2160 hour(s))  CBC with Differential/Platelet     Status: None   Collection Time: 04/05/16 11:16 AM  Result Value Ref Range   WBC 4.6 4.0 - 10.5 K/uL   RBC 4.40 3.87 - 5.11 MIL/uL   Hemoglobin 13.1 12.0 - 15.0 g/dL   HCT 40.5 36.0 - 46.0 %   MCV 92.0 78.0 - 100.0 fL   MCH 29.8 26.0 - 34.0 pg   MCHC 32.3 30.0 - 36.0 g/dL   RDW 13.7 11.5 - 15.5 %   Platelets 299 150 - 400 K/uL   Neutrophils Relative % 54 %   Neutro Abs 2.5 1.7 - 7.7 K/uL   Lymphocytes Relative 34 %   Lymphs Abs 1.5 0.7 - 4.0 K/uL   Monocytes Relative 8 %   Monocytes Absolute 0.4 0.1 - 1.0 K/uL   Eosinophils Relative 3 %   Eosinophils Absolute 0.2 0.0 - 0.7 K/uL   Basophils Relative 1 %   Basophils Absolute 0.1 0.0 - 0.1 K/uL  Comprehensive metabolic panel     Status: Abnormal   Collection Time: 04/05/16 11:16 AM  Result Value Ref Range   Sodium 140 135 - 145 mmol/L   Potassium 4.3 3.5 - 5.1 mmol/L   Chloride 108 101 - 111 mmol/L   CO2 25 22 - 32 mmol/L   Glucose, Bld 94 65 - 99 mg/dL   BUN 14 6 - 20 mg/dL   Creatinine, Ser 0.73 0.44 - 1.00 mg/dL   Calcium 9.3 8.9 - 10.3 mg/dL   Total Protein 6.3 (L) 6.5 - 8.1 g/dL   Albumin 3.7 3.5 - 5.0 g/dL   AST 34 15 - 41 U/L   ALT 33 14 - 54 U/L   Alkaline  Phosphatase 76 38 - 126 U/L   Total Bilirubin 1.0 0.3 - 1.2 mg/dL   GFR calc non Af Amer >60 >60 mL/min   GFR calc Af Amer >60 >60 mL/min    Comment: (NOTE) The eGFR has been calculated using the CKD EPI equation. This calculation has not been validated in all clinical situations. eGFR's persistently <60 mL/min signify possible Chronic Kidney Disease.    Anion gap 7 5 - 15  Lipase, blood     Status: None   Collection Time: 04/05/16 11:16 AM  Result Value Ref Range   Lipase 30 11 -  51 U/L  Troponin I     Status: None   Collection Time: 04/05/16 11:16 AM  Result Value Ref Range   Troponin I <0.03 <0.031 ng/mL    Comment:        NO INDICATION OF MYOCARDIAL INJURY.   D-dimer, quantitative (not at [facility])     Status: None   Collection Time: 04/05/16 11:16 AM  Result Value Ref Range   D-Dimer, Quant <0.27 0.00 - 0.50 ug/mL-FEU    Comment: (NOTE) At the manufacturer cut-off of 0.50 ug/mL FEU, this assay has been documented to exclude PE with a sensitivity and negative predictive value of 97 to 99%.  At this time, this assay has not been approved by the FDA to exclude DVT/VTE. Results should be correlated with clinical presentation.   Troponin I     Status: None   Collection Time: 04/05/16  3:25 PM  Result Value Ref Range   Troponin I <0.03 <0.031 ng/mL    Comment:        NO INDICATION OF MYOCARDIAL INJURY.   Troponin I (q 6hr x 3)     Status: None   Collection Time: 04/05/16  5:01 PM  Result Value Ref Range   Troponin I <0.03 <0.031 ng/mL    Comment:        NO INDICATION OF MYOCARDIAL INJURY.   Troponin I (q 6hr x 3)     Status: None   Collection Time: 04/05/16 10:30 PM  Result Value Ref Range   Troponin I <0.03 <0.031 ng/mL    Comment:        NO INDICATION OF MYOCARDIAL INJURY.   Troponin I (q 6hr x 3)     Status: None   Collection Time: 04/06/16  5:15 AM  Result Value Ref Range   Troponin I <0.03 <0.031 ng/mL    Comment:        NO INDICATION OF MYOCARDIAL  INJURY.   Lipid panel     Status: None   Collection Time: 04/06/16  5:15 AM  Result Value Ref Range   Cholesterol 121 0 - 200 mg/dL   Triglycerides 36 <150 mg/dL   HDL 62 >40 mg/dL   Total CHOL/HDL Ratio 2.0 RATIO   VLDL 7 0 - 40 mg/dL   LDL Cholesterol 52 0 - 99 mg/dL    Comment:        Total Cholesterol/HDL:CHD Risk Coronary Heart Disease Risk Table                     Men   Women  1/2 Average Risk   3.4   3.3  Average Risk       5.0   4.4  2 X Average Risk   9.6   7.1  3 X Average Risk  23.4   11.0        Use the calculated Patient Ratio above and the CHD Risk Table to determine the patient's CHD Risk.        ATP III CLASSIFICATION (LDL):  <100     mg/dL   Optimal  100-129  mg/dL   Near or Above                    Optimal  130-159  mg/dL   Borderline  160-189  mg/dL   High  >190     mg/dL   Very High     General Appearance: alert, oriented, no acute distress and well nourished  Musculoskeletal:  Strength & Muscle Tone: within normal limits Gait & Station: normal Patient leans: N/A  Psychiatric Specialty Exam: Physical Exam  Review of Systems  Skin: Negative for itching and rash.  Neurological: Negative for dizziness, tremors and headaches.    Blood pressure 110/72, pulse 84, height '5\' 3"'  (1.6 m), weight 132 lb (59.875 kg), last menstrual period 02/15/2014.Body mass index is 23.39 kg/(m^2).  General Appearance: Casual  Eye Contact::  Good  Speech:  Normal Rate  Volume:  Normal  Mood:  Euthymic  Affect:  Appropriate  Thought Process:  Coherent  Orientation:  Full (Time, Place, and Person)  Thought Content:  WDL  Suicidal Thoughts:  No  Homicidal Thoughts:  No  Memory:  Immediate;   Good Recent;   Good Remote;   Good  Judgement:  Good  Insight:  Good  Psychomotor Activity:  Normal  Concentration:  Good  Recall:  Good  Fund of Knowledge:  Good  Language:  Good  Akathisia:  No  Handed:  Right  AIMS (if indicated):     Assets:  Communication  Skills Desire for Improvement Financial Resources/Insurance Housing Physical Health Social Support  ADL's:  Intact  Cognition:  WNL  Sleep:       Established Problem, Stable/Improving (1), Review of Psycho-Social Stressors (1), Review or order clinical lab tests (1), New Problem, with no additional work-up planned (3), Review of Last Therapy Session (1), Review of Medication Regimen & Side Effects (2) and Review of New Medication or Change in Dosage (2)  Assessment: Axis I: Depressive disorder NOS, attention deficit disorder  Axis II: Deferred  Axis III: See medical history  Plan:  I review her blood work results from the emergency room and her current medication.  She has lost old and 15 to 20 pounds in past 3 months.  She like to cut down her Lamictal because she believe it is causing memory and concentration problem.  She is sad because she has to put her dog down on May 6.  I recommended counseling but patient declined.  She is scheduled to have colonoscopy on July 6.  We will decrease Lamictal 100 mg twice a day and continue Dexedrine 30 mg in the morning.  I recommended to try low dose Dexedrine but patient does not want to change her stimulant as it is helping her energy level and ADD symptoms.  Recommended to call us back if she has any question, concern if he feel worsening of the symptom.  Follow-up in 3 months.   Vernisha Bacote T., MD 04/14/2016

## 2016-04-15 ENCOUNTER — Ambulatory Visit: Payer: Self-pay | Admitting: Cardiology

## 2016-04-29 ENCOUNTER — Other Ambulatory Visit: Payer: Self-pay | Admitting: Gastroenterology

## 2016-05-22 ENCOUNTER — Encounter (HOSPITAL_COMMUNITY): Payer: Self-pay | Admitting: *Deleted

## 2016-06-01 ENCOUNTER — Ambulatory Visit (HOSPITAL_COMMUNITY)
Admission: RE | Admit: 2016-06-01 | Discharge: 2016-06-01 | Disposition: A | Discharge status: Home / Self Care | Payer: BLUE CROSS/BLUE SHIELD | Source: Ambulatory Visit | Attending: Gastroenterology | Admitting: Gastroenterology

## 2016-06-01 ENCOUNTER — Encounter (HOSPITAL_COMMUNITY)
Admission: RE | Disposition: A | Discharge status: Home / Self Care | Payer: Self-pay | Source: Ambulatory Visit | Attending: Gastroenterology

## 2016-06-01 ENCOUNTER — Encounter (HOSPITAL_COMMUNITY): Payer: Self-pay | Admitting: Registered Nurse

## 2016-06-01 ENCOUNTER — Ambulatory Visit (HOSPITAL_COMMUNITY): Payer: BLUE CROSS/BLUE SHIELD | Admitting: Registered Nurse

## 2016-06-01 DIAGNOSIS — Z1211 Encounter for screening for malignant neoplasm of colon: Secondary | ICD-10-CM | POA: Insufficient documentation

## 2016-06-01 DIAGNOSIS — R630 Anorexia: Secondary | ICD-10-CM | POA: Diagnosis not present

## 2016-06-01 DIAGNOSIS — Z79899 Other long term (current) drug therapy: Secondary | ICD-10-CM | POA: Diagnosis not present

## 2016-06-01 DIAGNOSIS — D122 Benign neoplasm of ascending colon: Secondary | ICD-10-CM | POA: Insufficient documentation

## 2016-06-01 DIAGNOSIS — K317 Polyp of stomach and duodenum: Secondary | ICD-10-CM | POA: Insufficient documentation

## 2016-06-01 DIAGNOSIS — J45909 Unspecified asthma, uncomplicated: Secondary | ICD-10-CM | POA: Diagnosis not present

## 2016-06-01 DIAGNOSIS — R634 Abnormal weight loss: Secondary | ICD-10-CM | POA: Diagnosis not present

## 2016-06-01 DIAGNOSIS — Z8719 Personal history of other diseases of the digestive system: Secondary | ICD-10-CM | POA: Diagnosis not present

## 2016-06-01 DIAGNOSIS — R1013 Epigastric pain: Secondary | ICD-10-CM | POA: Insufficient documentation

## 2016-06-01 DIAGNOSIS — R110 Nausea: Secondary | ICD-10-CM | POA: Diagnosis not present

## 2016-06-01 HISTORY — PX: ESOPHAGOGASTRODUODENOSCOPY (EGD) WITH PROPOFOL: SHX5813

## 2016-06-01 HISTORY — DX: Other specified postprocedural states: Z98.890

## 2016-06-01 HISTORY — PX: COLONOSCOPY WITH PROPOFOL: SHX5780

## 2016-06-01 HISTORY — DX: Nausea with vomiting, unspecified: R11.2

## 2016-06-01 SURGERY — COLONOSCOPY WITH PROPOFOL
Anesthesia: Monitor Anesthesia Care

## 2016-06-01 MED ORDER — PROPOFOL 500 MG/50ML IV EMUL
INTRAVENOUS | Status: DC | PRN
  Administered 2016-06-01: 300 ug/kg/min via INTRAVENOUS

## 2016-06-01 MED ORDER — ONDANSETRON HCL 4 MG/2ML IJ SOLN
INTRAMUSCULAR | Status: AC
  Filled 2016-06-01: qty 2

## 2016-06-01 MED ORDER — PROPOFOL 10 MG/ML IV BOLUS
INTRAVENOUS | Status: AC
  Filled 2016-06-01: qty 40

## 2016-06-01 MED ORDER — SODIUM CHLORIDE 0.9 % IV SOLN
INTRAVENOUS | Status: DC | PRN
  Administered 2016-06-01: 13:00:00 via INTRAVENOUS

## 2016-06-01 MED ORDER — LACTATED RINGERS IV SOLN
INTRAVENOUS | Status: DC | PRN
  Administered 2016-06-01: 12:00:00 via INTRAVENOUS

## 2016-06-01 MED ORDER — LIDOCAINE HCL (CARDIAC) 20 MG/ML IV SOLN
INTRAVENOUS | Status: DC | PRN
  Administered 2016-06-01: 100 mg via INTRAVENOUS

## 2016-06-01 MED ORDER — ONDANSETRON HCL 4 MG/2ML IJ SOLN
INTRAMUSCULAR | Status: DC | PRN
  Administered 2016-06-01: 4 mg via INTRAVENOUS

## 2016-06-01 MED ORDER — PROPOFOL 10 MG/ML IV BOLUS
INTRAVENOUS | Status: AC
  Filled 2016-06-01: qty 20

## 2016-06-01 MED ORDER — GLYCOPYRROLATE 0.2 MG/ML IJ SOLN
INTRAMUSCULAR | Status: DC | PRN
  Administered 2016-06-01: 0.2 mg via INTRAVENOUS

## 2016-06-01 MED ORDER — LIDOCAINE HCL (CARDIAC) 20 MG/ML IV SOLN
INTRAVENOUS | Status: AC
  Filled 2016-06-01: qty 5

## 2016-06-01 SURGICAL SUPPLY — 25 items

## 2016-06-01 NOTE — Discharge Instructions (Signed)
Esophagogastroduodenoscopy, Care After Refer to this sheet in the next few weeks. These instructions provide you with information about caring for yourself after your procedure. Your health care provider may also give you more specific instructions. Your treatment has been planned according to current medical practices, but problems sometimes occur. Call your health care provider if you have any problems or questions after your procedure. WHAT TO EXPECT AFTER THE PROCEDURE After your procedure, it is typical to feel:  Soreness in your throat.  Pain with swallowing.  Sick to your stomach (nauseous).  Bloated.  Dizzy.  Fatigued. HOME CARE INSTRUCTIONS  Do not eat or drink anything until the numbing medicine (local anesthetic) has worn off and your gag reflex has returned. You will know that the local anesthetic has worn off when you can swallow comfortably.  Do not drive or operate machinery until directed by your health care provider.  Take medicines only as directed by your health care provider. SEEK MEDICAL CARE IF:   You cannot stop coughing.  You are not urinating at all or less than usual. SEEK IMMEDIATE MEDICAL CARE IF:  You have difficulty swallowing.  You cannot eat or drink.  You have worsening throat or chest pain.  You have dizziness or lightheadedness or you faint.  You have nausea or vomiting.  You have chills.  You have a fever.  You have severe abdominal pain.  You have black, tarry, or bloody stools.   This information is not intended to replace advice given to you by your health care provider. Make sure you discuss any questions you have with your health care provider.   Document Released: 10/12/2012 Document Revised: 11/16/2014 Document Reviewed: 10/12/2012 Elsevier Interactive Patient Education 2016 [employer]. Colonoscopy, Care After These instructions give you information on caring for yourself after your procedure. Your doctor may also give  you more specific instructions. Call your doctor if you have any problems or questions after your procedure. HOME CARE  Do not drive for 24 hours.  Do not sign important papers or use machinery for 24 hours.  You may shower.  You may go back to your usual activities, but go slower for the first 24 hours.  Take rest breaks often during the first 24 hours.  Walk around or use warm packs on your belly (abdomen) if you have belly cramping or gas.  Drink enough fluids to keep your pee (urine) clear or pale yellow.  Resume your normal diet. Avoid heavy or fried foods.  Avoid drinking alcohol for 24 hours or as told by your doctor.  Only take medicines as told by your doctor. If a tissue sample (biopsy) was taken during the procedure:   Do not take aspirin or blood thinners for 7 days, or as told by your doctor.  Do not drink alcohol for 7 days, or as told by your doctor.  Eat soft foods for the first 24 hours. GET HELP IF: You still have a small amount of blood in your poop (stool) 2-3 days after the procedure. GET HELP RIGHT AWAY IF:  You have more than a small amount of blood in your poop.  You see clumps of tissue (blood clots) in your poop.  Your belly is puffy (swollen).  You feel sick to your stomach (nauseous) or throw up (vomit).  You have a fever.  You have belly pain that gets worse and medicine does not help. MAKE SURE YOU:  Understand these instructions.  Will watch your condition.  Will  get help right away if you are not doing well or get worse.   This information is not intended to replace advice given to you by your health care provider. Make sure you discuss any questions you have with your health care provider.   Document Released: 11/28/2010 Document Revised: 10/31/2013 Document Reviewed: 07/03/2013 Elsevier Interactive Patient Education [employer].

## 2016-06-01 NOTE — Anesthesia Postprocedure Evaluation (Signed)
Anesthesia Post Note  Patient: Darshana Herold-[name]  Procedure(s) Performed: Procedure(s) (LRB): COLONOSCOPY WITH PROPOFOL (N/A) ESOPHAGOGASTRODUODENOSCOPY (EGD) WITH PROPOFOL (N/A)  Patient location during evaluation: PACU Anesthesia Type: MAC Level of consciousness: awake and alert Pain management: pain level controlled Vital Signs Assessment: post-procedure vital signs reviewed and stable Respiratory status: spontaneous breathing, nonlabored ventilation and respiratory function stable Cardiovascular status: stable and blood pressure returned to baseline Anesthetic complications: no    Last Vitals:  Vitals:   06/01/16 1420 06/01/16 1430  BP: 105/62 114/74  Pulse: 82 85  Resp: 14 18  Temp:      Last Pain:  Vitals:   06/01/16 1341  TempSrc: Oral                 Aeliana Spates,W. EDMOND

## 2016-06-01 NOTE — Op Note (Signed)
[facility] Patient Name: Courtney Lee Procedure Date: 06/01/2016 MRN: [id number] Attending MD: Garlan Fair , MD Date of Birth: 11/04/1961 CSN: [id number] Age: 55 Admit Type: Outpatient Procedure:                Upper GI endoscopy Indications:              Epigastric abdominal pain, Nausea Providers:                Garlan Fair, MD, Hilma Favors, RN, Alfonso Patten, Technician, Enrigue Catena, CRNA Referring MD:              Medicines:                Propofol per Anesthesia Complications:            No immediate complications. Estimated Blood Loss:     Estimated blood loss: none. Procedure:                Pre-Anesthesia Assessment:                           - Prior to the procedure, a History and Physical                            was performed, and patient medications and                            allergies were reviewed. The patient's tolerance of                            previous anesthesia was also reviewed. The risks                            and benefits of the procedure and the sedation                            options and risks were discussed with the patient.                            All questions were answered, and informed consent                            was obtained. Prior Anticoagulants: The patient has                            taken no previous anticoagulant or antiplatelet                            agents. ASA Grade Assessment: II - A patient with                            mild systemic disease. After reviewing the risks  and benefits, the patient was deemed in                            satisfactory condition to undergo the procedure.                           After obtaining informed consent, the endoscope was                            passed under direct vision. Throughout the                            procedure, the patient's blood pressure, pulse, and                  oxygen saturations were monitored continuously. The                            [id number]) scope was introduced through the                            mouth, and advanced to the second part of duodenum.                            The upper GI endoscopy was accomplished without                            difficulty. The patient tolerated the procedure                            well. Scope In: Scope Out: Findings:      The Z-line was regular and was found 40 cm from the incisors.      The examined esophagus was normal.      The entire examined stomach was normal except for scattered <1mm benign       fundic gland polyps in the gastric fundus.. Biopsies were taken with a       cold forceps for Helicobacter pylori testing.      The examined duodenum was normal. Biopsies for histology were taken with       a cold forceps for evaluation of celiac disease. Impression:               - Z-line regular, 40 cm from the incisors.                           - Normal esophagus.                           - Normal stomach. Biopsied.                           - Normal examined duodenum. Biopsied. Moderate Sedation:      N/A- Per Anesthesia Care Recommendation:           - Await pathology results.                           -  Resume previous diet.                           - Continue present medications. Procedure Code(s):        --- Professional ---                           [phone], Esophagogastroduodenoscopy, flexible,                            transoral; with biopsy, single or multiple Diagnosis Code(s):        --- Professional ---                           R10.13, Epigastric pain                           R11.0, Nausea CPT copyright 2016 American Medical Association. All rights reserved. The codes documented in this report are preliminary and upon coder review may  be revised to meet current compliance requirements. Earle Gell, MD Garlan Fair, MD 06/01/2016 1:46:53  PM This report has been signed electronically. Number of Addenda: 0

## 2016-06-01 NOTE — H&P (Signed)
  Problems: Unintentional weight loss with anorexia and nausea. Screening colonoscopy. 09/20/2009 diagnostic esophagogastroduodenoscopy showed benign gastric fundic gland polyps.  History: The patient is a 55 year old female born [birth year]. She has never undergone colorectal neoplasia screening. She was diagnosed with a stomach ulcer at age 73. She takes Excedrin for headaches.  She has unexplained weight loss due to anorexia and postprandial nausea without vomiting. She also has epigastric discomfort. Serologic screen for H. pylori gastritis was negative. Abdominal ultrasound was normal. Liver enzymes were normal. She has been on a gluten-free and lactose-free diet for 2 years.  She is scheduled to undergo a diagnostic esophagogastroduodenoscopy with screen for celiac disease and H. pylori gastritis followed by screening colonoscopy.  Past medical history: Asthma. Migraine headache syndrome. Depression. Osteoarthritis. Asthma. Cesarean section.  Medication allergies: Erythromycin and codeine  Exam: The patient is alert and lying comfortably on the endoscopy stretcher. Abdomen is soft and nontender to palpation. Lungs are clear to auscultation. Cardiac exam reveals a regular rhythm.  Plan: Proceed with diagnostic esophagogastroduodenoscopy followed by screening colonoscopy

## 2016-06-01 NOTE — Transfer of Care (Signed)
Immediate Anesthesia Transfer of Care Note  Patient: Courtney Lee  Procedure(s) Performed: Procedure(s): COLONOSCOPY WITH PROPOFOL (N/A) ESOPHAGOGASTRODUODENOSCOPY (EGD) WITH PROPOFOL (N/A)  Patient Location: PACU  Anesthesia Type:MAC  Level of Consciousness: awake, alert , oriented and patient cooperative  Airway & Oxygen Therapy: Patient Spontanous Breathing and Patient connected to face mask oxygen  Post-op Assessment: Report given to RN, Post -op Vital signs reviewed and stable and Patient moving all extremities X 4  Post vital signs: stable  Last Vitals:  Vitals:   06/01/16 1148  BP: 119/86  Pulse: 82  Resp: 13  Temp: 36.5 C    Last Pain:  Vitals:   06/01/16 1148  TempSrc: Oral         Complications: No apparent anesthesia complications

## 2016-06-01 NOTE — Anesthesia Preprocedure Evaluation (Addendum)
Anesthesia Evaluation  Patient identified by MRN, date of birth, ID band Patient awake    Reviewed: Allergy & Precautions, H&P , NPO status , Patient's Chart, lab work & pertinent test results  History of Anesthesia Complications (+) PONV  Airway Mallampati: II  TM Distance: >3 FB Neck ROM: Full    Dental no notable dental hx. (+) Teeth Intact, Dental Advisory Given   Pulmonary asthma ,    Pulmonary exam normal breath sounds clear to auscultation       Cardiovascular negative cardio ROS   Rhythm:Regular Rate:Normal     Neuro/Psych  Headaches, Depression    GI/Hepatic negative GI ROS, Neg liver ROS,   Endo/Other  negative endocrine ROS  Renal/GU negative Renal ROS  negative genitourinary   Musculoskeletal   Abdominal   Peds  Hematology negative hematology ROS (+)   Anesthesia Other Findings   Reproductive/Obstetrics negative OB ROS                            Anesthesia Physical Anesthesia Plan  ASA: II  Anesthesia Plan: MAC   Post-op Pain Management:    Induction: Intravenous  Airway Management Planned: Nasal Cannula  Additional Equipment:   Intra-op Plan:   Post-operative Plan:   Informed Consent: I have reviewed the patients History and Physical, chart, labs and discussed the procedure including the risks, benefits and alternatives for the proposed anesthesia with the patient or authorized representative who has indicated his/her understanding and acceptance.   Dental advisory given  Plan Discussed with: CRNA  Anesthesia Plan Comments:        Anesthesia Quick Evaluation

## 2016-06-01 NOTE — Op Note (Addendum)
[facility] Patient Name: Courtney Lee Procedure Date: 06/01/2016 MRN: [id number] Attending MD: Garlan Fair , MD Date of Birth: 1961/04/05 CSN: [id number] Age: 55 Admit Type: Outpatient Procedure:                Colonoscopy Indications:              Screening for colorectal malignant neoplasm Providers:                Garlan Fair, MD, Hilma Favors, RN, Alfonso Patten, Technician, Enrigue Catena, CRNA Referring MD:              Medicines:                Propofol per Anesthesia Complications:            No immediate complications. Estimated Blood Loss:     Estimated blood loss: none. Procedure:                Pre-Anesthesia Assessment:                           - Prior to the procedure, a History and Physical                            was performed, and patient medications and                            allergies were reviewed. The patient's tolerance of                            previous anesthesia was also reviewed. The risks                            and benefits of the procedure and the sedation                            options and risks were discussed with the patient.                            All questions were answered, and informed consent                            was obtained. Prior Anticoagulants: The patient has                            taken no previous anticoagulant or antiplatelet                            agents. ASA Grade Assessment: II - A patient with                            mild systemic disease. After reviewing the risks  and benefits, the patient was deemed in                            satisfactory condition to undergo the procedure.                           After obtaining informed consent, the colonoscope                            was passed under direct vision. Throughout the                            procedure, the patient's blood pressure, pulse, and                       oxygen saturations were monitored continuously. The                            Colonoscope was introduced through the anus and                            advanced to the the cecum, identified by                            appendiceal orifice and ileocecal valve. The                            colonoscopy was technically difficult and complex                            due to significant looping. The patient tolerated                            the procedure well. The quality of the bowel                            preparation was good. The appendiceal orifice and                            the rectum were photographed. Scope In: 12:53:53 PM Scope Out: 1:29:50 PM Scope Withdrawal Time: 0 hours 17 minutes 25 seconds  Total Procedure Duration: 0 hours 35 minutes 57 seconds  Findings:      The perianal and digital rectal examinations were normal.      Two sessile polyps were found in the proximal ascending colon. The       polyps were 5 mm in size. These polyps were removed with a cold snare.       Resection and retrieval were complete.      The exam was otherwise without abnormality. Impression:               - Two 5 mm polyps in the proximal ascending colon,                            removed with a cold snare. Resected and retrieved.                           -  The examination was otherwise normal. Moderate Sedation:      N/A- Per Anesthesia Care Recommendation:           - Patient has a contact number available for                            emergencies. The signs and symptoms of potential                            delayed complications were discussed with the                            patient. Return to normal activities tomorrow.                            Written discharge instructions were provided to the                            patient.                           - Repeat colonoscopy date to be determined after                            pending pathology  results are reviewed for                            surveillance.                           - Resume previous diet.                           - Continue present medications. Procedure Code(s):        --- Professional ---                           [phone], Colonoscopy, flexible; with removal of                            tumor(s), polyp(s), or other lesion(s) by snare                            technique Diagnosis Code(s):        --- Professional ---                           Z12.11, Encounter for screening for malignant                            neoplasm of colon                           D12.2, Benign neoplasm of ascending colon CPT copyright 2016 [employer]. All rights reserved. The codes documented in this report are preliminary and upon coder review may  be revised to meet current compliance requirements. Earle Gell, MD Ursula Alert  Wynetta Emery, MD 06/01/2016 1:42:07 PM This report has been signed electronically. Number of Addenda: 1 Addendum Number: 1   Addendum Date: 06/02/2016 7:35:59 AM      Prior to performing the patient's colonoscopy, I performed a digital       rectal exam and felt an extrinsic mass pushing in on the anterior wall       of the rectum. Endoscopically the rectum appeared normal. The patient       was informed of this finding and will consult with her gynecologist to       repeat her digital rectal exam. She may require a transvaginal and       pelvic ultrasound. Earle Gell, MD Garlan Fair, MD 06/02/2016 7:38:27 AM This report has been signed electronically.

## 2016-06-02 ENCOUNTER — Encounter (HOSPITAL_COMMUNITY): Payer: Self-pay | Admitting: Gastroenterology

## 2016-06-19 ENCOUNTER — Encounter (HOSPITAL_COMMUNITY): Payer: Self-pay | Admitting: Gastroenterology

## 2016-07-15 ENCOUNTER — Ambulatory Visit (HOSPITAL_COMMUNITY): Payer: Self-pay | Admitting: Psychiatry

## 2016-08-11 ENCOUNTER — Other Ambulatory Visit (HOSPITAL_COMMUNITY): Payer: Self-pay | Admitting: Psychiatry

## 2016-08-11 ENCOUNTER — Telehealth (HOSPITAL_COMMUNITY): Payer: Self-pay

## 2016-08-11 DIAGNOSIS — F900 Attention-deficit hyperactivity disorder, predominantly inattentive type: Secondary | ICD-10-CM

## 2016-08-11 MED ORDER — DEXTROAMPHETAMINE SULFATE ER 15 MG PO CP24
ORAL_CAPSULE | ORAL | 0 refills | Status: DC
Start: 2016-08-11 — End: 2016-09-01

## 2016-08-11 NOTE — Telephone Encounter (Signed)
Patient is calling for a refill on her Dextroamphetamine and Lamictal. Patient was last here on 6/6 and has a follow up on 10/24 - okay to refill? Please review and advise, thank you.

## 2016-08-12 ENCOUNTER — Telehealth (HOSPITAL_COMMUNITY): Payer: Self-pay

## 2016-08-12 NOTE — Telephone Encounter (Signed)
08/12/16 9:43am  [id number] [name] (husband) came and pick-up rx script.Marland KitchenMariana Lee

## 2016-09-01 ENCOUNTER — Ambulatory Visit (INDEPENDENT_AMBULATORY_CARE_PROVIDER_SITE_OTHER): Payer: BLUE CROSS/BLUE SHIELD | Admitting: Psychiatry

## 2016-09-01 ENCOUNTER — Encounter (HOSPITAL_COMMUNITY): Payer: Self-pay | Admitting: Psychiatry

## 2016-09-01 DIAGNOSIS — F900 Attention-deficit hyperactivity disorder, predominantly inattentive type: Secondary | ICD-10-CM

## 2016-09-01 MED ORDER — DEXTROAMPHETAMINE SULFATE ER 15 MG PO CP24: ORAL_CAPSULE | ORAL | 0 refills | Status: DC

## 2016-09-01 MED ORDER — LAMOTRIGINE 100 MG PO TABS: 100.0000 mg | ORAL_TABLET | Freq: Two times a day (BID) | ORAL | 2 refills | Status: DC

## 2016-09-01 NOTE — Progress Notes (Signed)
Courtney Lee Progress Note  Courtney Lee [id number] 55 y.o.  09/01/2016 4:00 PM  Chief Complaint:  Medication management and follow-up.      History of Present Illness: Courtney Lee came for her followup appointment.  She is doing better on her current medication.  She had an endoscopy and colonoscopy and she was told everything is fine.  She is not losing weight and she actually gained weight from the past.  She described her mood is good.  She like to come off from the Lamictal in the future but at this time she wants to continue 100 mg twice a day.  Her attention, focus and multitasking is good.  She has no tremors, shakes or any insomnia.  She denies any chest palpitation or any tremors.  Her energy level is good.  Recently she visited [place] with her husband and she had a good time.  Patient denies any paranoia, hallucination, suicidal thoughts or homicidal thought.  She denies any mania or psychosis.  She denies drinking alcohol or using any illegal substances. Patient lives with her husband and reported relationship is going very well.  Suicidal Ideation: No Plan Formed: No Patient has means to carry out plan: No  Homicidal Ideation: No Plan Formed: No Patient has means to carry out plan: No  Review of Systems: Psychiatric: Agitation: No Hallucination: No Depressed Mood: No Insomnia: No Hypersomnia: No Altered Concentration: No Feels Worthless: No Grandiose Ideas: No Belief In Special Powers: No New/Increased Substance Abuse: No Compulsions: No  Neurologic: Headache: Yes Seizure: No Paresthesias: No  Medical history. Patient has history of anemia, asthma and headaches.  Her primary care physician is Dr. Nehemiah Settle  Outpatient Encounter Prescriptions as of 09/01/2016  Medication Sig Dispense Refill  . adapalene (DIFFERIN) 0.1 % gel Apply 1 application topically at bedtime as needed.   0  . caffeine 200 MG TABS tablet Take 200 mg by mouth  daily as needed.    . Cholecalciferol (VITAMIN D3) 5000 units TABS Take 1 tablet by mouth every Monday, Wednesday, and Friday.    Marland Kitchen dextroamphetamine (DEXEDRINE SPANSULE) 15 MG 24 hr capsule Take 2 in AM 60 capsule 0  . FROVA 2.5 MG tablet Take 2.5 mg by mouth daily as needed for migraine.   0  . lamoTRIgine (LAMICTAL) 100 MG tablet Take 1 tablet (100 mg total) by mouth 2 (two) times daily. 60 tablet 2  . Multiple Vitamin (MULTIVITAMIN WITH MINERALS) TABS tablet Take 1 tablet by mouth daily.    Marland Kitchen OVER THE COUNTER MEDICATION Take 1 capsule by mouth daily. Tumeric w/black pepper    . PROAIR HFA 108 (90 BASE) MCG/ACT inhaler Inhale 1 puff into the lungs every 4 (four) hours as needed for wheezing.   0  . [DISCONTINUED] dextroamphetamine (DEXEDRINE SPANSULE) 15 MG 24 hr capsule Take 2 in AM 60 capsule 0  . [DISCONTINUED] dextroamphetamine (DEXEDRINE SPANSULE) 15 MG 24 hr capsule Take 2 in AM 60 capsule 0  . [DISCONTINUED] dextroamphetamine (DEXEDRINE SPANSULE) 15 MG 24 hr capsule Take 2 in AM 60 capsule 0  . [DISCONTINUED] lamoTRIgine (LAMICTAL) 100 MG tablet Take 1 tablet (100 mg total) by mouth 2 (two) times daily. 60 tablet 2   No facility-administered encounter medications on file as of 09/01/2016.     Past Psychiatric History/Hospitalization(s): Patient is a long history of depression and ADD. In the past she has taken Prozac Effexor however she had a good response with Lamictal and Dexedrine.  Anxiety: Yes Bipolar  Disorder: No Depression: Yes Mania: No Psychosis: No Schizophrenia: No Personality Disorder: No Hospitalization for psychiatric illness: No History of Electroconvulsive Shock Therapy: No Prior Suicide Attempts: No  Physical Exam: Constitutional:  BP 112/70   Pulse 91   Ht 5\' 3"  (1.6 m)   Wt 136 lb 9.6 oz (62 kg)   LMP 02/15/2014   BMI 24.20 kg/m   No results found for this or any previous visit (from the past 2160 hour(s)).  General Appearance: alert, oriented, no  acute distress and well nourished  Musculoskeletal: Strength & Muscle Tone: within normal limits Gait & Station: normal Patient leans: N/A  Psychiatric Specialty Exam: Physical Exam  Review of Systems  Skin: Negative for itching and rash.  Neurological: Negative for dizziness, tremors and headaches.    Blood pressure 112/70, pulse 91, height 5\' 3"  (1.6 m), weight 136 lb 9.6 oz (62 kg), last menstrual period 02/15/2014.Body mass index is 24.2 kg/m.  General Appearance: Casual  Eye Contact::  Good  Speech:  Normal Rate  Volume:  Normal  Mood:  Euthymic  Affect:  Appropriate  Thought Process:  Coherent  Orientation:  Full (Time, Place, and Person)  Thought Content:  WDL  Suicidal Thoughts:  No  Homicidal Thoughts:  No  Memory:  Immediate;   Good Recent;   Good Remote;   Good  Judgement:  Good  Insight:  Good  Psychomotor Activity:  Normal  Concentration:  Good  Recall:  Good  Fund of Knowledge:  Good  Language:  Good  Akathisia:  No  Handed:  Right  AIMS (if indicated):     Assets:  Communication Skills Desire for Improvement Financial Resources/Insurance Housing Physical Health Social Support  ADL's:  Intact  Cognition:  WNL  Sleep:       Established Problem, Stable/Improving (1), Review of Last Therapy Session (1) and Review of Medication Regimen & Side Effects (2)  Assessment: Axis I: Depressive disorder NOS, attention deficit disorder  Axis II: Deferred  Axis III: See medical history  Plan:  Patient is doing well on her current psychiatric medication.  She wants to continue Lamictal 100 mg twice a day and Dexedrine 30 mg daily.  Her goal is to come off from psychiatric medication and in the future she will cut down further Lamictal.  She has no rash or any side effects.  Recommended to call us back if she has any question, concern if he feel worsening of the symptom.  Follow-up in 3 months.   Trenden Hazelrigg T., MD 09/01/2016

## 2016-09-18 ENCOUNTER — Other Ambulatory Visit: Payer: Self-pay | Admitting: Nurse Practitioner

## 2016-09-18 ENCOUNTER — Ambulatory Visit
Admission: RE | Admit: 2016-09-18 | Discharge: 2016-09-18 | Disposition: A | Discharge status: Home / Self Care | Payer: BLUE CROSS/BLUE SHIELD | Source: Ambulatory Visit | Attending: Nurse Practitioner | Admitting: Nurse Practitioner

## 2016-09-18 DIAGNOSIS — M25552 Pain in left hip: Secondary | ICD-10-CM

## 2016-11-18 ENCOUNTER — Other Ambulatory Visit (HOSPITAL_COMMUNITY): Payer: Self-pay | Admitting: Psychiatry

## 2016-11-18 ENCOUNTER — Telehealth (HOSPITAL_COMMUNITY): Payer: Self-pay

## 2016-11-18 ENCOUNTER — Telehealth (HOSPITAL_COMMUNITY): Payer: Self-pay | Admitting: Psychiatry

## 2016-11-18 DIAGNOSIS — F900 Attention-deficit hyperactivity disorder, predominantly inattentive type: Secondary | ICD-10-CM

## 2016-11-18 MED ORDER — DEXTROAMPHETAMINE SULFATE ER 15 MG PO CP24
ORAL_CAPSULE | ORAL | 0 refills | Status: DC
Start: 2016-11-18 — End: 2016-12-16

## 2016-11-18 NOTE — Telephone Encounter (Signed)
Gaspar Bidding, spouse picked up prescription on [phone]  [place]  [phone]  dlo

## 2016-11-18 NOTE — Telephone Encounter (Signed)
Patient called and reports that she can not find her prescription of Dextroamphetamine for January. I called the pharmacy and they confirmed that the last fill was at the end of November for the December prescription, but they do not have the January one. Would you like to replace the prescription? Patient has a follow up on the 25th

## 2016-12-03 ENCOUNTER — Ambulatory Visit (HOSPITAL_COMMUNITY): Payer: Self-pay | Admitting: Psychiatry

## 2016-12-16 ENCOUNTER — Encounter (HOSPITAL_COMMUNITY): Payer: Self-pay | Admitting: Psychiatry

## 2016-12-16 ENCOUNTER — Ambulatory Visit (INDEPENDENT_AMBULATORY_CARE_PROVIDER_SITE_OTHER): Payer: BLUE CROSS/BLUE SHIELD | Admitting: Psychiatry

## 2016-12-16 DIAGNOSIS — Z888 Allergy status to other drugs, medicaments and biological substances status: Secondary | ICD-10-CM | POA: Diagnosis not present

## 2016-12-16 DIAGNOSIS — Z9851 Tubal ligation status: Secondary | ICD-10-CM | POA: Diagnosis not present

## 2016-12-16 DIAGNOSIS — F900 Attention-deficit hyperactivity disorder, predominantly inattentive type: Secondary | ICD-10-CM

## 2016-12-16 DIAGNOSIS — Z9889 Other specified postprocedural states: Secondary | ICD-10-CM

## 2016-12-16 DIAGNOSIS — Z79899 Other long term (current) drug therapy: Secondary | ICD-10-CM

## 2016-12-16 MED ORDER — LAMOTRIGINE 100 MG PO TABS: 100.0000 mg | ORAL_TABLET | Freq: Two times a day (BID) | ORAL | 0 refills | Status: DC

## 2016-12-16 MED ORDER — DEXTROAMPHETAMINE SULFATE ER 15 MG PO CP24
ORAL_CAPSULE | ORAL | 0 refills | Status: DC
Start: 2016-12-16 — End: 2016-12-16

## 2016-12-16 MED ORDER — DEXTROAMPHETAMINE SULFATE ER 15 MG PO CP24: ORAL_CAPSULE | ORAL | 0 refills | Status: DC

## 2016-12-16 NOTE — Progress Notes (Signed)
[place] MD/PA/NP OP Progress Note  12/16/2016 10:41 AM Courtney Lee  MRN:  [id number]  Chief Complaint:  Subjective:  I'm doing good.  I had a good Christmas.  HPI: Courtney Lee came for her follow-up appointment.  She is taking her medication and reported no side effects.  Her attention, concentration and multitasking is good.  She has no tremors shakes, insomnia or any palpitation.  She had a very good Christmas.  Patient denies any hallucination, paranoia, suicidal thoughts or homicidal thought.  She denies any insomnia or any manic symptoms.  Patient denies drinking alcohol or using any illegal substances.  She lives with her husband and reported relationship is going very well.  She wants to continue Lamictal and Dexedrine.  Her vitals are stable.  Visit Diagnosis:    ICD-9-CM ICD-10-CM   1. Attention deficit hyperactivity disorder (ADHD), predominantly inattentive type 314.00 F90.0 lamoTRIgine (LAMICTAL) 100 MG tablet     dextroamphetamine (DEXEDRINE SPANSULE) 15 MG 24 hr capsule     DISCONTINUED: dextroamphetamine (DEXEDRINE SPANSULE) 15 MG 24 hr capsule     DISCONTINUED: dextroamphetamine (DEXEDRINE SPANSULE) 15 MG 24 hr capsule    Past Psychiatric History: Patient has history of depression and attention deficit disorder.  In the past she has taken Prozac, Effexor however she had a good response with Lamictal and Dexedrine.  Patient denies any history of psychiatric inpatient treatment or any suicidal attempt.  Past Medical History:  Past Medical History:  Diagnosis Date  . Abnormal Pap smear early 20's  . Asthma   . Depression   . Headache(784.0)    miagraine  . PONV (postoperative nausea and vomiting)     Past Surgical History:  Procedure Laterality Date  . CARPAL TUNNEL RELEASE  2003   right hand  . CESAREAN SECTION     x 3  . COLONOSCOPY WITH PROPOFOL N/A 06/01/2016   Procedure: COLONOSCOPY WITH PROPOFOL;  Surgeon: Garlan Fair, MD;  Location: [facility];   Service: Endoscopy;  Laterality: N/A;  . ESOPHAGOGASTRODUODENOSCOPY (EGD) WITH PROPOFOL N/A 06/01/2016   Procedure: ESOPHAGOGASTRODUODENOSCOPY (EGD) WITH PROPOFOL;  Surgeon: Garlan Fair, MD;  Location: [facility];  Service: Endoscopy;  Laterality: N/A;  . GYNECOLOGIC CRYOSURGERY    . LEEP    . TUBAL LIGATION    . WISDOM TOOTH EXTRACTION      Family Psychiatric History: Reviewed.  Family History:  Family History  Problem Relation Age of Onset  . Hypertension    . Uterine cancer      Great MGM  . Stomach cancer Maternal Grandmother     Social History:  Social History   Social History  . Marital status: Married    Spouse name: N/A  . Number of children: N/A  . Years of education: N/A   Social History Main Topics  . Smoking status: Never Smoker  . Smokeless tobacco: Never Used  . Alcohol use No  . Drug use: No  . Sexual activity: Not on file   Other Topics Concern  . Not on file   Social History Narrative  . No narrative on file    Allergies:  Allergies  Allergen Reactions  . Codeine Other (See Comments)    miagraines  . Erythromycin Other (See Comments)    Major yeast infection  . Gluten Meal Other (See Comments)    migraines    Metabolic Disorder Labs: No results found for: HGBA1C, MPG No results found for: PROLACTIN Lab Results  Component Value Date   CHOL 121  04/06/2016   TRIG 36 04/06/2016   HDL 62 04/06/2016   CHOLHDL 2.0 04/06/2016   VLDL 7 04/06/2016   LDLCALC 52 04/06/2016     Current Medications: Current Outpatient Prescriptions  Medication Sig Dispense Refill  . adapalene (DIFFERIN) 0.1 % gel Apply 1 application topically at bedtime as needed.   0  . caffeine 200 MG TABS tablet Take 200 mg by mouth daily as needed.    . Cholecalciferol (VITAMIN D3) 5000 units TABS Take 1 tablet by mouth every Monday, Wednesday, and Friday.    Marland Kitchen dextroamphetamine (DEXEDRINE SPANSULE) 15 MG 24 hr capsule Take 2 in AM 60 capsule 0  . FROVA 2.5 MG  tablet Take 2.5 mg by mouth daily as needed for migraine.   0  . lamoTRIgine (LAMICTAL) 100 MG tablet Take 1 tablet (100 mg total) by mouth 2 (two) times daily. 180 tablet 0  . Multiple Vitamin (MULTIVITAMIN WITH MINERALS) TABS tablet Take 1 tablet by mouth daily.    Marland Kitchen OVER THE COUNTER MEDICATION Take 1 capsule by mouth daily. Tumeric w/black pepper    . PROAIR HFA 108 (90 BASE) MCG/ACT inhaler Inhale 1 puff into the lungs every 4 (four) hours as needed for wheezing.   0   No current facility-administered medications for this visit.     Neurologic: Headache: No Seizure: No Paresthesias: No  Musculoskeletal: Strength & Muscle Tone: within normal limits Gait & Station: normal Patient leans: N/A  Psychiatric Specialty Exam: Review of Systems  Constitutional: Negative.   HENT: Negative.   Musculoskeletal: Negative.   Skin: Negative.  Negative for itching and rash.  Neurological: Negative.     Blood pressure 118/78, pulse 80, height 5\' 3"  (1.6 m), weight 137 lb 6.4 oz (62.3 kg), last menstrual period 02/15/2014.Body mass index is 24.34 kg/m.  General Appearance: Casual and Well Groomed  Eye Contact:  Good  Speech:  Clear and Coherent  Volume:  Normal  Mood:  Euthymic  Affect:  Appropriate and Congruent  Thought Process:  Goal Directed  Orientation:  Full (Time, Place, and Person)  Thought Content: WDL and Logical   Suicidal Thoughts:  No  Homicidal Thoughts:  No  Memory:  Immediate;   Good Recent;   Good Remote;   Good  Judgement:  Good  Insight:  Good  Psychomotor Activity:  Normal  Concentration:  Concentration: Fair and Attention Span: Fair  Recall:  Good  Fund of Knowledge: Good  Language: Good  Akathisia:  No  Handed:  Right  AIMS (if indicated):  0  Assets:  Communication Skills Desire for Improvement Intimacy Leisure Time Physical Health Resilience Social Support  ADL's:  Intact  Cognition: WNL  Sleep:  Good    Assessment: Major depressive disorder,  recurrent mild.  Attention deficit disorder inattentive type.  Plan: Patient is a stable on her her current medication.  She has no side effects.  I will continue Dexedrine 30 mg daily and Lamictal 100 mg twice a day.  Recommended to call us back if she has any question, concern if she feels worsening of the symptom.  Follow-up in 3 months.    Allannah Kempen T., MD 12/16/2016, 10:41 AM

## 2017-01-16 ENCOUNTER — Other Ambulatory Visit (HOSPITAL_COMMUNITY): Payer: Self-pay | Admitting: Psychiatry

## 2017-01-16 DIAGNOSIS — F900 Attention-deficit hyperactivity disorder, predominantly inattentive type: Secondary | ICD-10-CM

## 2017-03-08 ENCOUNTER — Encounter (HOSPITAL_COMMUNITY): Payer: Self-pay | Admitting: Psychiatry

## 2017-03-08 ENCOUNTER — Ambulatory Visit (INDEPENDENT_AMBULATORY_CARE_PROVIDER_SITE_OTHER): Payer: BLUE CROSS/BLUE SHIELD | Admitting: Psychiatry

## 2017-03-08 VITALS — BP 128/70 | HR 84 | Ht 63.0 in | Wt 140.2 lb

## 2017-03-08 DIAGNOSIS — F419 Anxiety disorder, unspecified: Secondary | ICD-10-CM

## 2017-03-08 DIAGNOSIS — G4700 Insomnia, unspecified: Secondary | ICD-10-CM | POA: Diagnosis not present

## 2017-03-08 DIAGNOSIS — F329 Major depressive disorder, single episode, unspecified: Secondary | ICD-10-CM | POA: Diagnosis not present

## 2017-03-08 DIAGNOSIS — F900 Attention-deficit hyperactivity disorder, predominantly inattentive type: Secondary | ICD-10-CM

## 2017-03-08 DIAGNOSIS — Z79899 Other long term (current) drug therapy: Secondary | ICD-10-CM | POA: Diagnosis not present

## 2017-03-08 MED ORDER — LAMOTRIGINE 100 MG PO TABS
100.0000 mg | ORAL_TABLET | Freq: Two times a day (BID) | ORAL | 0 refills | Status: DC
Start: 2017-03-08 — End: 2017-06-09

## 2017-03-08 MED ORDER — DEXTROAMPHETAMINE SULFATE ER 15 MG PO CP24: ORAL_CAPSULE | ORAL | 0 refills | Status: DC

## 2017-03-08 MED ORDER — HYDROXYZINE PAMOATE 25 MG PO CAPS: ORAL_CAPSULE | ORAL | 1 refills | Status: DC

## 2017-03-08 NOTE — Progress Notes (Signed)
[facility] MD/PA/NP OP Progress Note  03/08/2017 11:23 AM Courtney Lee  MRN:  [id number]  Chief Complaint:  Subjective:  I was bit by a dog last week.  HPI: Courtney Lee came for his follow-up appointment.  She was bit by a dog pitbull last week on her face.  She require multiple stitches on her face.  Patient told she was walking with her dog when neighbors dog decided to have barking and both dog started to have altercation.  After that she was a bit by a neighbor's dog causes massive laceration, bleeding and she fell down.  She do not remember the details very well but realized someone call 911 and she was taken to the hospital.  Patient has multiple stitches on her right side of cheek.  She may require plastic surgery opinion.  She was very upset with the incident.  Patient is even thinking about taking legal actions because she had informed the neighbors about their dog behavior in the past.  Patient also got upset when find out that dog was not vaccinated and she took rabies injection.  She admitted sometimes she has racing thoughts and night and she couldn't sleep very well.  She is thinking about the incident almost every night but denies any nightmares.  She denies any crying spells but admitted feeling more anxious nervous and fearful.  She tried multiple over-the-counter medication for sleep but did not work.  She denies any suicidal thoughts or homicidal thought.  Her husband is very supportive.  She is taking Dexedrine, Lamictal which is helping her mood and depression.  She has no rash, itching, tremors, shakes or any EPS.  Her attention and focus is good.  Her energy level is fair.  Patient denies drinking alcohol or using any illegal substances.  She lives with her husband who is very supportive.  Visit Diagnosis:    ICD-9-CM ICD-10-CM   1. Attention deficit hyperactivity disorder (ADHD), predominantly inattentive type 314.00 F90.0 lamoTRIgine (LAMICTAL) 100 MG tablet   dextroamphetamine (DEXEDRINE SPANSULE) 15 MG 24 hr capsule     DISCONTINUED: dextroamphetamine (DEXEDRINE SPANSULE) 15 MG 24 hr capsule     DISCONTINUED: dextroamphetamine (DEXEDRINE SPANSULE) 15 MG 24 hr capsule  2. Anxiety 300.00 F41.9 hydrOXYzine (VISTARIL) 25 MG capsule    Past Psychiatric History:  Patient has long history of depression and ADD.  In the past she has taken Prozac, Effexor but limited response.  Patient denies any history of psychiatric inpatient treatment, psychosis, hallucination or any paranoia.  Past Medical History:  Past Medical History:  Diagnosis Date  . Abnormal Pap smear early 20's  . Asthma   . Depression   . Headache(784.0)    miagraine  . PONV (postoperative nausea and vomiting)     Past Surgical History:  Procedure Laterality Date  . CARPAL TUNNEL RELEASE  2003   right hand  . CESAREAN SECTION     x 3  . COLONOSCOPY WITH PROPOFOL N/A 06/01/2016   Procedure: COLONOSCOPY WITH PROPOFOL;  Surgeon: Garlan Fair, MD;  Location: [facility];  Service: Endoscopy;  Laterality: N/A;  . ESOPHAGOGASTRODUODENOSCOPY (EGD) WITH PROPOFOL N/A 06/01/2016   Procedure: ESOPHAGOGASTRODUODENOSCOPY (EGD) WITH PROPOFOL;  Surgeon: Garlan Fair, MD;  Location: [facility];  Service: Endoscopy;  Laterality: N/A;  . GYNECOLOGIC CRYOSURGERY    . LEEP    . TUBAL LIGATION    . WISDOM TOOTH EXTRACTION      Family Psychiatric History: Reviewed.  Family History:  Family History  Problem Relation  Age of Onset  . Hypertension    . Uterine cancer      Great MGM  . Stomach cancer Maternal Grandmother     Social History:  Social History   Social History  . Marital status: Married    Spouse name: N/A  . Number of children: N/A  . Years of education: N/A   Social History Main Topics  . Smoking status: Never Smoker  . Smokeless tobacco: Never Used  . Alcohol use No  . Drug use: No  . Sexual activity: Not Asked   Other Topics Concern  . None   Social  History Narrative  . None    Allergies:  Allergies  Allergen Reactions  . Codeine Other (See Comments)    miagraines  . Erythromycin Other (See Comments)    Major yeast infection  . Gluten Meal Other (See Comments)    migraines    Metabolic Disorder Labs: No results found for: HGBA1C, MPG No results found for: PROLACTIN Lab Results  Component Value Date   CHOL 121 04/06/2016   TRIG 36 04/06/2016   HDL 62 04/06/2016   CHOLHDL 2.0 04/06/2016   VLDL 7 04/06/2016   LDLCALC 52 04/06/2016     Current Medications: Current Outpatient Prescriptions  Medication Sig Dispense Refill  . adapalene (DIFFERIN) 0.1 % gel Apply 1 application topically at bedtime as needed.   0  . caffeine 200 MG TABS tablet Take 200 mg by mouth daily as needed.    . Cholecalciferol (VITAMIN D3) 5000 units TABS Take 1 tablet by mouth every Monday, Wednesday, and Friday.    Marland Kitchen dextroamphetamine (DEXEDRINE SPANSULE) 15 MG 24 hr capsule Take 2 in AM 60 capsule 0  . FROVA 2.5 MG tablet Take 2.5 mg by mouth daily as needed for migraine.   0  . lamoTRIgine (LAMICTAL) 100 MG tablet Take 1 tablet (100 mg total) by mouth 2 (two) times daily. 180 tablet 0  . Multiple Vitamin (MULTIVITAMIN WITH MINERALS) TABS tablet Take 1 tablet by mouth daily.    Marland Kitchen OVER THE COUNTER MEDICATION Take 1 capsule by mouth daily. Tumeric w/black pepper    . PROAIR HFA 108 (90 BASE) MCG/ACT inhaler Inhale 1 puff into the lungs every 4 (four) hours as needed for wheezing.   0   No current facility-administered medications for this visit.     Neurologic: Headache: No Seizure: No Paresthesias: No  Musculoskeletal: Strength & Muscle Tone: within normal limits Gait & Station: normal Patient leans: N/A  Psychiatric Specialty Exam: Review of Systems  Constitutional: Negative.   Eyes: Negative.   Cardiovascular: Negative.   Skin:       Multiple stitches on her right side of cheek  Neurological: Negative.   Psychiatric/Behavioral:  The patient is nervous/anxious and has insomnia.     Blood pressure 128/70, pulse 84, height 5\' 3"  (1.6 m), weight 140 lb 3.2 oz (63.6 kg), last menstrual period 02/15/2014.Body mass index is 24.84 kg/m.  General Appearance: Casual  Eye Contact:  Good  Speech:  Clear and Coherent  Volume:  Normal  Mood:  Anxious  Affect:  Constricted  Thought Process:  Goal Directed  Orientation:  Full (Time, Place, and Person)  Thought Content: Rumination   Suicidal Thoughts:  No  Homicidal Thoughts:  No  Memory:  Immediate;   Good Recent;   Good Remote;   Good  Judgement:  Good  Insight:  Good  Psychomotor Activity:  Normal  Concentration:  Concentration: Fair and Attention  Span: Good  Recall:  Good  Fund of Knowledge: Good  Language: Good  Akathisia:  No  Handed:  Right  AIMS (if indicated):  0  Assets:  Communication Skills Desire for Improvement Housing Resilience Social Support  ADL's:  Intact  Cognition: WNL  Sleep:  Fair    Assessment: Depressive disorder NOS.  Attention deficit disorder inattentive type.  Anxiety disorder NOS.  Insomnia  Plan: I review her psychosocial stressors, current medication and recent visit to the emergency room in blood work results.  Patient is experiencing insomnia and anxiety after the incident when she was bit by a dog.  She is concerned about her face, she had multiple stitches and she will need to see a Psychiatric nurse.  I offered counseling but patient already in process of getting counseling in [place].  She had tried multiple over-the-counter medication for insomnia but limited response.  I recommended to try Vistaril 25 mg 1-2 capsule as needed for insomnia and anxiety.  Continue Lamictal 100 mg tablet twice a day and Dexedrine 15 mg 2 tablet in the morning.  Discussed medication side effects and benefits.  Reassurance given.  Recommended to call us back if she feels worsening of the symptom.  Discuss safety plan that anytime having active  suicidal thoughts or homicidal thoughts then she need to call 911 or go to the local emergency room.  I will see her again in 3 months.  Time spent more than 25 minutes.  More than 50% of the time spent in psychoeducation, counseling and coordination of care.  Herold Salguero T., MD 03/08/2017, 11:23 AM

## 2017-06-07 ENCOUNTER — Ambulatory Visit (HOSPITAL_COMMUNITY): Payer: Self-pay | Admitting: Psychiatry

## 2017-06-09 ENCOUNTER — Encounter (HOSPITAL_COMMUNITY): Payer: Self-pay | Admitting: Psychiatry

## 2017-06-09 ENCOUNTER — Ambulatory Visit (INDEPENDENT_AMBULATORY_CARE_PROVIDER_SITE_OTHER): Payer: BLUE CROSS/BLUE SHIELD | Admitting: Psychiatry

## 2017-06-09 DIAGNOSIS — F419 Anxiety disorder, unspecified: Secondary | ICD-10-CM

## 2017-06-09 DIAGNOSIS — F332 Major depressive disorder, recurrent severe without psychotic features: Secondary | ICD-10-CM | POA: Diagnosis not present

## 2017-06-09 DIAGNOSIS — F900 Attention-deficit hyperactivity disorder, predominantly inattentive type: Secondary | ICD-10-CM

## 2017-06-09 IMAGING — CR DG HIP (WITH OR WITHOUT PELVIS) 2-3V*L*
2 series · 2 of 2 positions shown · non-contrast
Comparison: None.

CLINICAL DATA: Left hip pain for 3-4 months, no injury

EXAM:
DG HIP (WITH OR WITHOUT PELVIS) 2-3V LEFT

[t hip ap left]
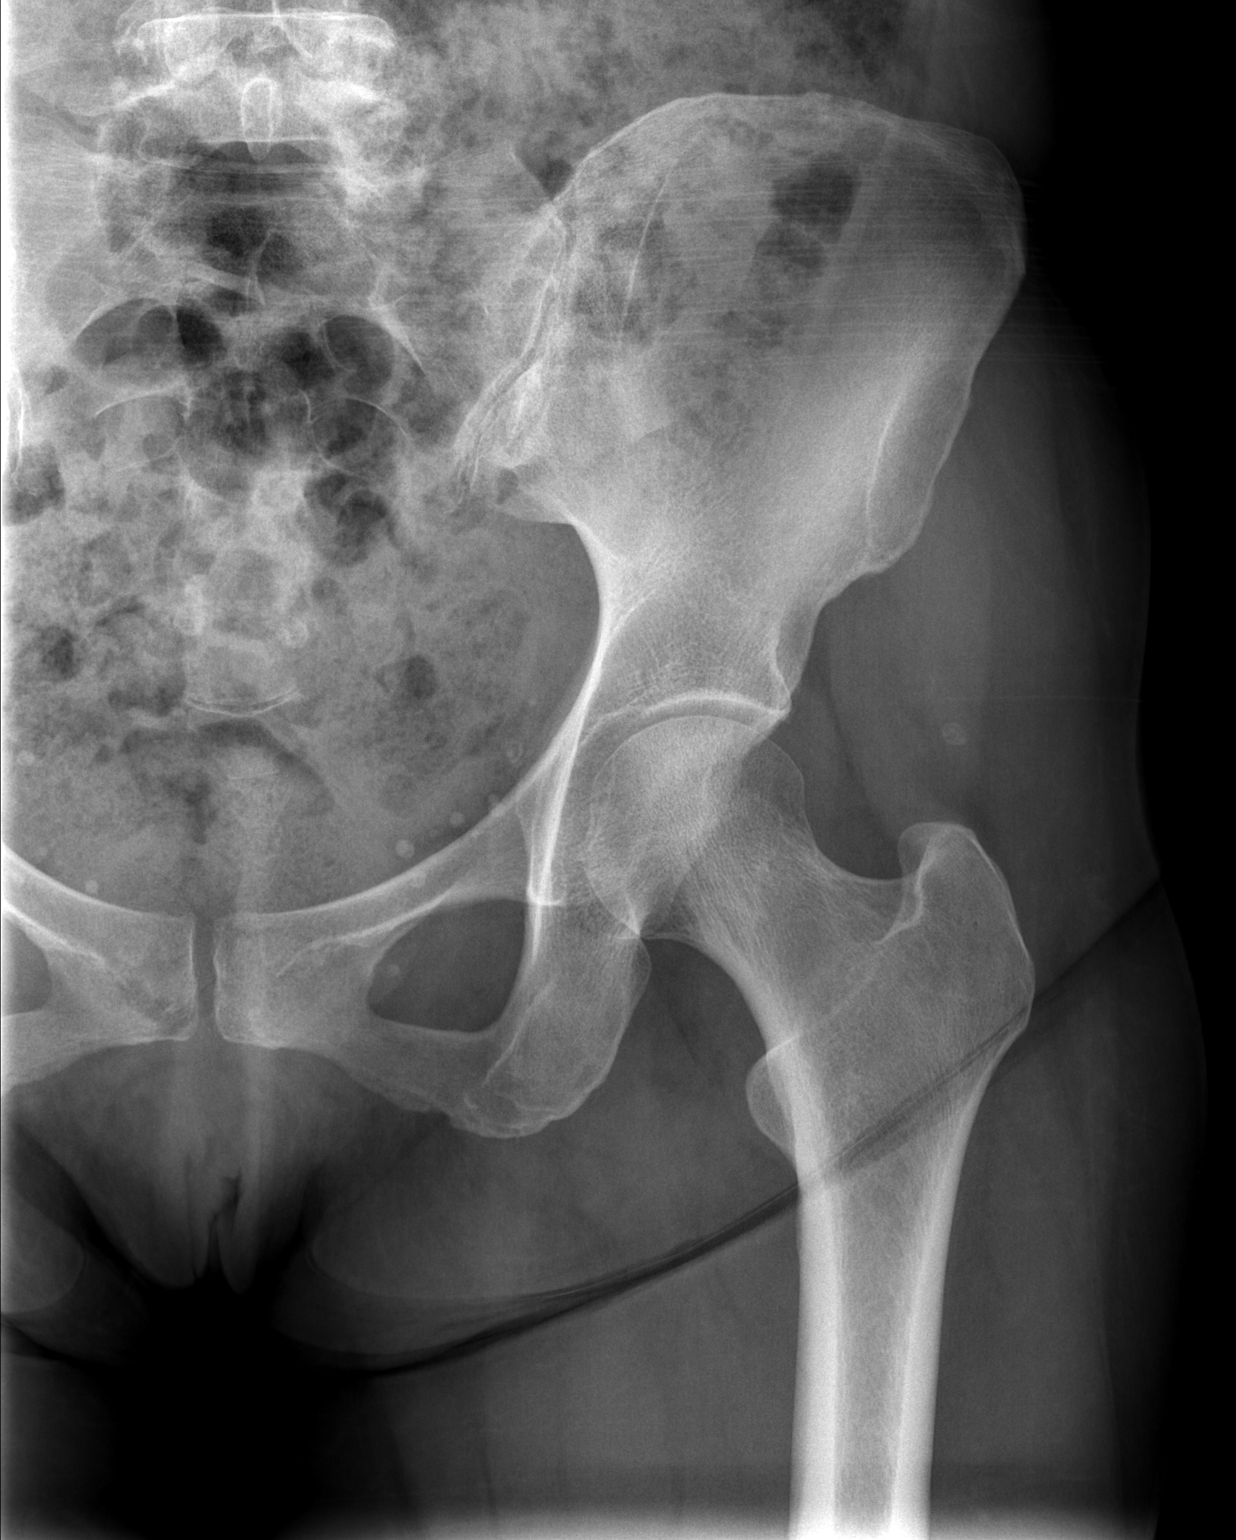

[t hip frog leg left]
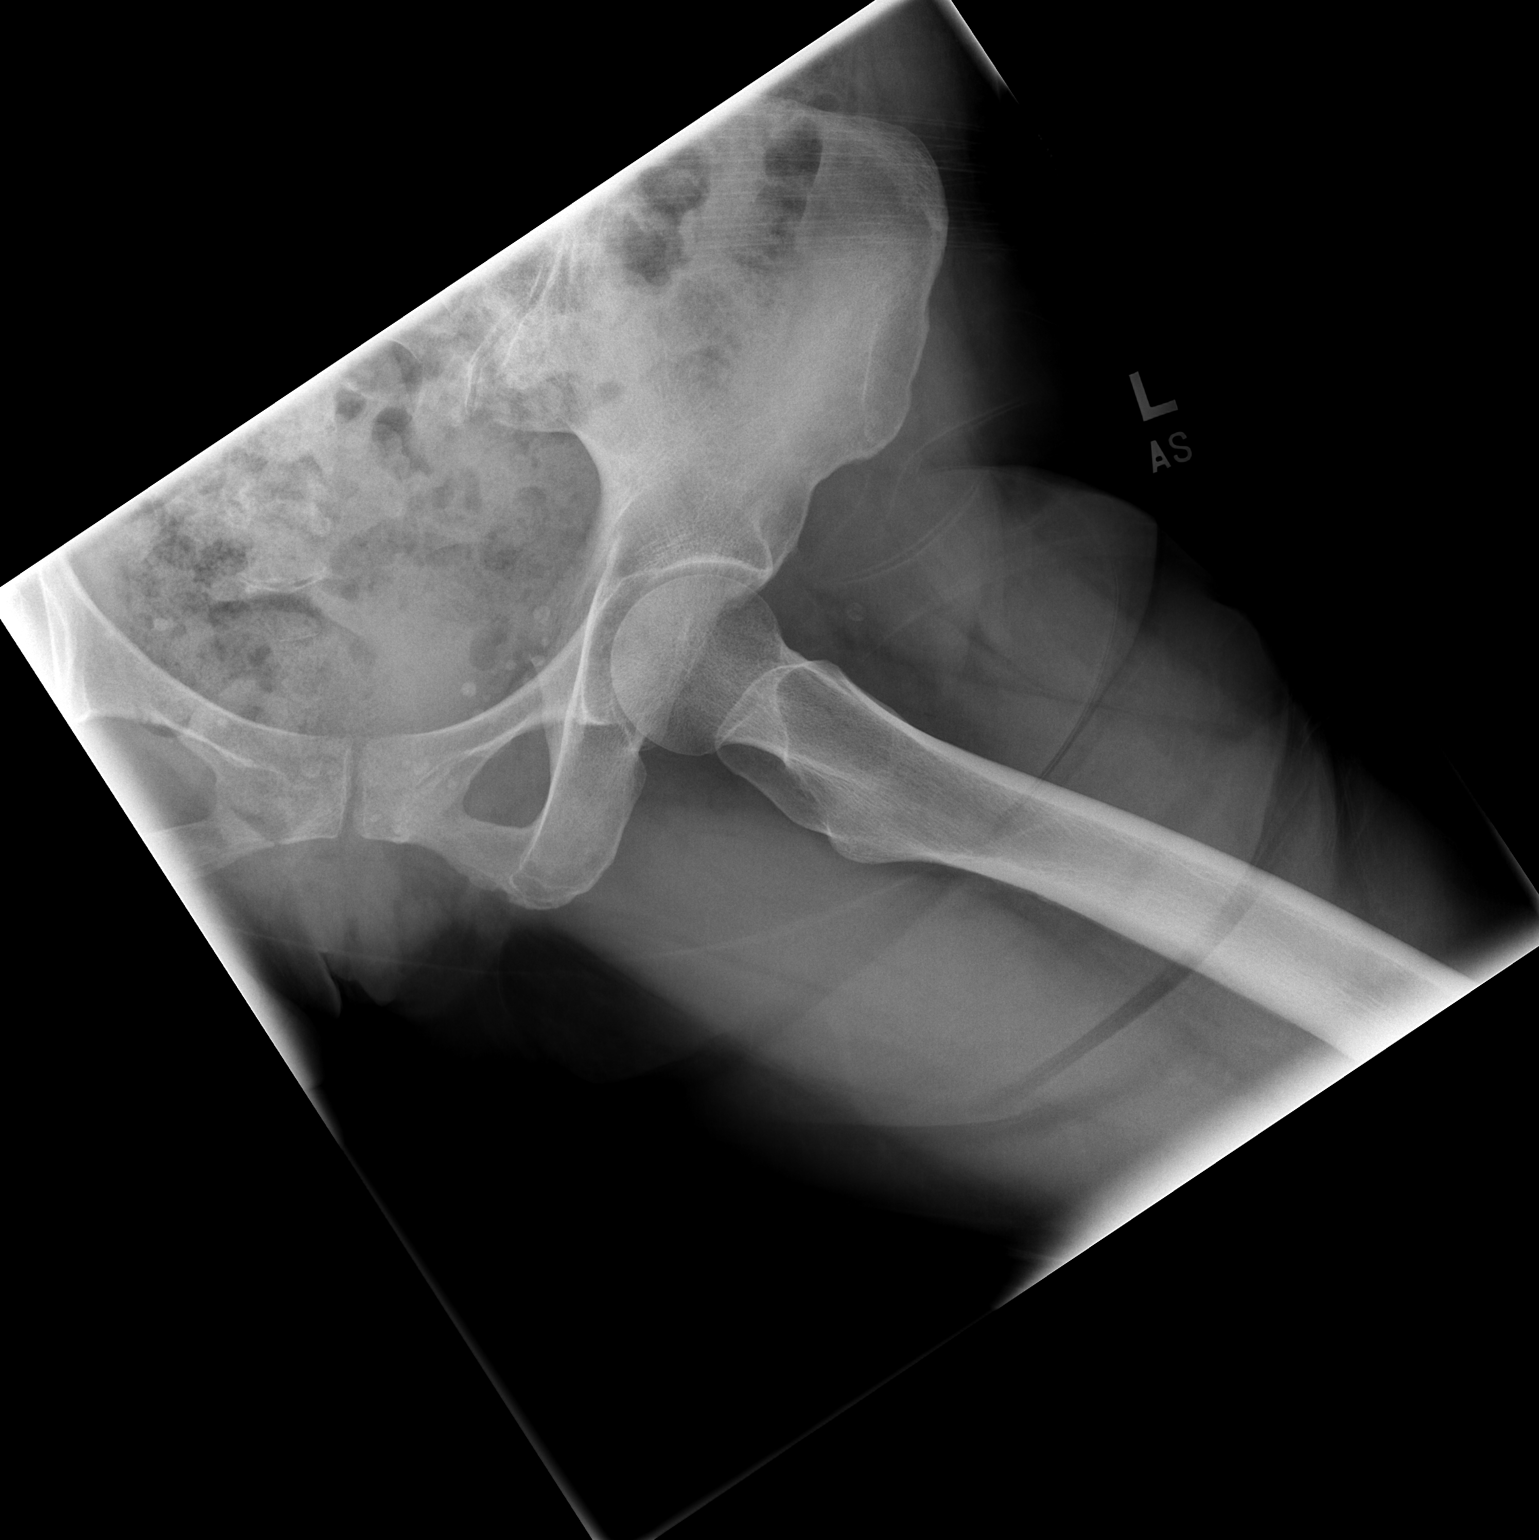

[2 of 2 positions shown; findings below may reference images not displayed]

FINDINGS: The left hip joint space appears normal. No significant degenerative
change. No acute abnormality is noted. The left pelvic ramus is
intact. The left SI joint is corticated.
IMPRESSION: Negative.

## 2017-06-09 MED ORDER — DEXTROAMPHETAMINE SULFATE ER 15 MG PO CP24: ORAL_CAPSULE | ORAL | 0 refills | Status: DC

## 2017-06-09 MED ORDER — HYDROXYZINE PAMOATE 25 MG PO CAPS: ORAL_CAPSULE | ORAL | 1 refills | Status: DC

## 2017-06-09 MED ORDER — LAMOTRIGINE 100 MG PO TABS: 100.0000 mg | ORAL_TABLET | Freq: Two times a day (BID) | ORAL | 0 refills | Status: DC

## 2017-06-09 NOTE — Progress Notes (Signed)
[facility] MD/PA/NP OP Progress Note  06/09/2017 10:13 AM Courtney Lee  MRN:  [id number]  Chief Complaint:  Subjective:  I am doing good.  I like sleep medication.  HPI: Courtney Lee came for her follow-up appointment.  On her last visit we added low dose Vistaril which is helping her sleep and anxiety.  Patient recently had a trip to [place] to visit her family.  She had a good time.  Patient denies any irritability, anger, mania, psychosis.  She is still getting treatment for her face.  She was bit by pit bull dog and she required multiple stitches.  She sued the owner of the dog and that is still in process.  Overall she described her mood is stable.  She is able to do multitasking.  Her attention concentration is good.  She has no crying spells.  Her sleep is improved.  She has no tremors, shakes, rash or any itching.  Her energy level is good.  Patient denies drinking alcohol or using any illegal substances.  She lives with her husband who is very supportive.  Recently she has 13th wedding anniversary.  Visit Diagnosis:    ICD-10-CM   1. Attention deficit hyperactivity disorder (ADHD), predominantly inattentive type F90.0 lamoTRIgine (LAMICTAL) 100 MG tablet    dextroamphetamine (DEXEDRINE SPANSULE) 15 MG 24 hr capsule    DISCONTINUED: dextroamphetamine (DEXEDRINE SPANSULE) 15 MG 24 hr capsule    DISCONTINUED: dextroamphetamine (DEXEDRINE SPANSULE) 15 MG 24 hr capsule  2. Anxiety F41.9 hydrOXYzine (VISTARIL) 25 MG capsule    Past Psychiatric History: Reviewed. Patient has history of depression, anxiety and ADD.  In the past she has taken Prozac, Effexor with limited response.  Patient denies any history of psychiatric inpatient treatment, psychosis, hallucination or any suicidal attempt.  Past Medical History:  Past Medical History:  Diagnosis Date  . Abnormal Pap smear early 20's  . Asthma   . Depression   . Headache(784.0)    miagraine  . PONV (postoperative nausea and vomiting)      Past Surgical History:  Procedure Laterality Date  . CARPAL TUNNEL RELEASE  2003   right hand  . CESAREAN SECTION     x 3  . COLONOSCOPY WITH PROPOFOL N/A 06/01/2016   Procedure: COLONOSCOPY WITH PROPOFOL;  Surgeon: Garlan Fair, MD;  Location: [facility];  Service: Endoscopy;  Laterality: N/A;  . ESOPHAGOGASTRODUODENOSCOPY (EGD) WITH PROPOFOL N/A 06/01/2016   Procedure: ESOPHAGOGASTRODUODENOSCOPY (EGD) WITH PROPOFOL;  Surgeon: Garlan Fair, MD;  Location: [facility];  Service: Endoscopy;  Laterality: N/A;  . GYNECOLOGIC CRYOSURGERY    . LEEP    . TUBAL LIGATION    . WISDOM TOOTH EXTRACTION      Family Psychiatric History: Reviewed.  Family History:  Family History  Problem Relation Age of Onset  . Hypertension Unknown   . Uterine cancer Unknown        Great MGM  . Stomach cancer Maternal Grandmother     Social History:  Social History   Social History  . Marital status: Married    Spouse name: N/A  . Number of children: N/A  . Years of education: N/A   Social History Main Topics  . Smoking status: Never Smoker  . Smokeless tobacco: Never Used  . Alcohol use No  . Drug use: No  . Sexual activity: Not Asked   Other Topics Concern  . None   Social History Narrative  . None    Allergies:  Allergies  Allergen Reactions  .  Codeine Other (See Comments)    miagraines  . Erythromycin Other (See Comments)    Major yeast infection  . Gluten Meal Other (See Comments)    migraines    Metabolic Disorder Labs: No results found for: HGBA1C, MPG No results found for: PROLACTIN Lab Results  Component Value Date   CHOL 121 04/06/2016   TRIG 36 04/06/2016   HDL 62 04/06/2016   CHOLHDL 2.0 04/06/2016   VLDL 7 04/06/2016   LDLCALC 52 04/06/2016     Current Medications: Current Outpatient Prescriptions  Medication Sig Dispense Refill  . caffeine 200 MG TABS tablet Take 200 mg by mouth daily as needed.    . Cholecalciferol (VITAMIN D3) 5000 units  TABS Take 1 tablet by mouth every Monday, Wednesday, and Friday.    Marland Kitchen dextroamphetamine (DEXEDRINE SPANSULE) 15 MG 24 hr capsule Take 2 in AM 60 capsule 0  . FROVA 2.5 MG tablet Take 2.5 mg by mouth daily as needed for migraine.   0  . hydrOXYzine (VISTARIL) 25 MG capsule Take 1-2 capsule at bed time for anxiety 45 capsule 1  . lamoTRIgine (LAMICTAL) 100 MG tablet Take 1 tablet (100 mg total) by mouth 2 (two) times daily. 180 tablet 0  . Multiple Vitamin (MULTIVITAMIN WITH MINERALS) TABS tablet Take 1 tablet by mouth daily.    Marland Kitchen OVER THE COUNTER MEDICATION Take 1 capsule by mouth daily. Tumeric w/black pepper    . PROAIR HFA 108 (90 BASE) MCG/ACT inhaler Inhale 1 puff into the lungs every 4 (four) hours as needed for wheezing.   0   No current facility-administered medications for this visit.     Neurologic: Headache: No Seizure: No Paresthesias: No  Musculoskeletal: Strength & Muscle Tone: within normal limits Gait & Station: normal Patient leans: N/A  Psychiatric Specialty Exam: ROS  Blood pressure 110/68, pulse 68, height 5\' 3"  (1.6 m), weight 143 lb 6.4 oz (65 kg), last menstrual period 02/15/2014.Body mass index is 25.4 kg/m.  General Appearance: Casual  Eye Contact:  Good  Speech:  Clear and Coherent  Volume:  Normal  Mood:  Euthymic  Affect:  Appropriate  Thought Process:  Goal Directed  Orientation:  Full (Time, Place, and Person)  Thought Content: Logical   Suicidal Thoughts:  No  Homicidal Thoughts:  No  Memory:  Immediate;   Good Recent;   Good Remote;   Good  Judgement:  Good  Insight:  Good  Psychomotor Activity:  Normal  Concentration:  Concentration: Good and Attention Span: Good  Recall:  Good  Fund of Knowledge: Good  Language: Good  Akathisia:  No  Handed:  Right  AIMS (if indicated):  0  Assets:  Communication Skills Desire for Improvement Housing Physical Health Resilience Social Support Transportation  ADL's:  Intact  Cognition: WNL   Sleep:  Good     Assessment: Major depressive disorder, recurrent.  Attention deficit disorder, inattentive type.  Anxiety disorder NOS  Plan: Patient is doing better on her current psychiatric medication.  She is taking Vistaril 25 mg at bedtime for insomnia.  Continue Lamictal 100 mg twice a day and Dexedrine 15 mg 2 tablet in the morning.  Discussed medication side effects and benefits.  Recommended to call us back if she has any question, concern or if she feels worsening of the symptom.  Follow-up in 3 months.  Emaley Applin T., MD 06/09/2017, 10:13 AM

## 2017-06-21 ENCOUNTER — Ambulatory Visit
Admission: RE | Admit: 2017-06-21 | Discharge: 2017-06-21 | Disposition: A | Discharge status: Home / Self Care | Payer: BLUE CROSS/BLUE SHIELD | Source: Ambulatory Visit | Attending: Internal Medicine | Admitting: Internal Medicine

## 2017-06-21 ENCOUNTER — Other Ambulatory Visit: Payer: Self-pay | Admitting: Internal Medicine

## 2017-06-21 DIAGNOSIS — M542 Cervicalgia: Secondary | ICD-10-CM

## 2017-07-22 ENCOUNTER — Other Ambulatory Visit (HOSPITAL_COMMUNITY): Payer: Self-pay | Admitting: Psychiatry

## 2017-07-22 ENCOUNTER — Telehealth (HOSPITAL_COMMUNITY): Payer: Self-pay

## 2017-07-22 NOTE — Telephone Encounter (Signed)
Patient drove to [place] and on the way she was attacked by a pit bull (for a second time) it did not bite her but it did bring up traumatic memories. Patient states that she is now very anxious and is having issues even going outside, She would like to know if you can call something in for her for the anxiety. She is currently on Vistaril and said it is not helping at all. Please review and advise, thank you   CB# [phone]

## 2017-07-22 NOTE — Telephone Encounter (Signed)
We can try BuSpar 5 mg twice a day.

## 2017-07-23 MED ORDER — BUSPIRONE HCL 5 MG PO TABS: 5.0000 mg | ORAL_TABLET | Freq: Two times a day (BID) | ORAL | 2 refills | Status: DC

## 2017-07-23 NOTE — Telephone Encounter (Signed)
Sent the new medication in and called the patient to let her know

## 2017-09-09 ENCOUNTER — Ambulatory Visit (INDEPENDENT_AMBULATORY_CARE_PROVIDER_SITE_OTHER): Payer: BLUE CROSS/BLUE SHIELD | Admitting: Psychiatry

## 2017-09-09 ENCOUNTER — Encounter (HOSPITAL_COMMUNITY): Payer: Self-pay | Admitting: Psychiatry

## 2017-09-09 DIAGNOSIS — F339 Major depressive disorder, recurrent, unspecified: Secondary | ICD-10-CM | POA: Diagnosis not present

## 2017-09-09 DIAGNOSIS — Z79899 Other long term (current) drug therapy: Secondary | ICD-10-CM

## 2017-09-09 DIAGNOSIS — F900 Attention-deficit hyperactivity disorder, predominantly inattentive type: Secondary | ICD-10-CM | POA: Diagnosis not present

## 2017-09-09 DIAGNOSIS — F419 Anxiety disorder, unspecified: Secondary | ICD-10-CM | POA: Diagnosis not present

## 2017-09-09 MED ORDER — DEXTROAMPHETAMINE SULFATE ER 15 MG PO CP24: ORAL_CAPSULE | ORAL | 0 refills | Status: DC

## 2017-09-09 MED ORDER — HYDROXYZINE PAMOATE 25 MG PO CAPS
ORAL_CAPSULE | ORAL | 1 refills | Status: DC
Start: 2017-09-09 — End: 2017-12-10

## 2017-09-09 MED ORDER — LAMOTRIGINE 150 MG PO TABS: 300.0000 mg | ORAL_TABLET | Freq: Every day | ORAL | 0 refills | Status: DC

## 2017-09-09 NOTE — Progress Notes (Signed)
[facility] MD/PA/NP OP Progress Note  09/09/2017 10:31 AM Courtney Lee  MRN:  [id number]  Chief Complaint: I was again attacked by pit bull when I was in [place].  HPI: Patient came for her follow-up appointment.  She endorsed feeling lately very scared, isolated and withdrawn.  She was again attacked by pit bull when she was on her way to [place].  She was staying in a hotel and she decided to walk outside and all of a sudden pitbull attacked but luckily did not touch her.  She has to ran and she called the staff of the hotel.  A few weeks later she was again walking in a park with her own dog and suddenly two pit bull came very close to her and she has to call the whistle and then stop.  Patient admitted these incidents causing a lot of anxiety.  She called from [facility] requesting anxiety medication and we have called in the BuSpar but she never picked up the medication.  She admitted some time having flashbacks.  However she is sleeping good.  She endorsed lately she is scared to go outside by herself.  Her [place] and she seen a lot of animals and she is afraid to walk by herself.  She used to run 4 miles every day but now she cut down significantly because she does not want to be attacked by animals.  She admitted sometimes feeling sadness, isolated and withdrawn but denies any suicidal thoughts or homicidal thought.  She is still seeing plastic surgeon Dr. Grandville Silos and getting treatment for her face.  She is upset because she has a scar on her right face.  She had filed insurance claim to the owner of dog which is still in process.  Patient overall feels that Lamictal Dexedrine is working.  She has no rash, itching, tremors or shakes.  She tried Vistaril few times but she feels it is low-dose and is not working.  Patient denies any hallucination, paranoia, suicidal thoughts or homicidal thoughts.  Her energy level is fair.  Her attention and consultation is good.  She is able  to do multitasking.  Her vital signs are stable.  Her children's are coming on Thanksgiving and she is excited about it.  After Christmas she is planning to go to [place] with her husband.  Patient denies drinking alcohol or using any illegal substances.  She lives with her husband who is very supportive.  Visit Diagnosis:    ICD-10-CM   1. Attention deficit hyperactivity disorder (ADHD), predominantly inattentive type F90.0 lamoTRIgine (LAMICTAL) 150 MG tablet    dextroamphetamine (DEXEDRINE SPANSULE) 15 MG 24 hr capsule    DISCONTINUED: dextroamphetamine (DEXEDRINE SPANSULE) 15 MG 24 hr capsule    DISCONTINUED: dextroamphetamine (DEXEDRINE SPANSULE) 15 MG 24 hr capsule  2. Anxiety F41.9 hydrOXYzine (VISTARIL) 25 MG capsule    Past Psychiatric History: Reviewed Patient has history of depression, anxiety and ADD.  In the past she has taken Prozac, Effexor with limited response.  Patient denies any history of psychiatric inpatient treatment, psychosis, hallucination or any suicidal attempt.  Past Medical History:  Past Medical History:  Diagnosis Date  . Abnormal Pap smear early 20's  . Asthma   . Depression   . Headache(784.0)    miagraine  . PONV (postoperative nausea and vomiting)     Past Surgical History:  Procedure Laterality Date  . CARPAL TUNNEL RELEASE  2003   right hand  . CESAREAN SECTION  x 3  . COLONOSCOPY WITH PROPOFOL N/A 06/01/2016   Procedure: COLONOSCOPY WITH PROPOFOL;  Surgeon: Garlan Fair, MD;  Location: [facility];  Service: Endoscopy;  Laterality: N/A;  . ESOPHAGOGASTRODUODENOSCOPY (EGD) WITH PROPOFOL N/A 06/01/2016   Procedure: ESOPHAGOGASTRODUODENOSCOPY (EGD) WITH PROPOFOL;  Surgeon: Garlan Fair, MD;  [facility];  Service: Endoscopy;  Laterality: N/A;  . GYNECOLOGIC CRYOSURGERY    . LEEP    . TUBAL LIGATION    . WISDOM TOOTH EXTRACTION      Family Psychiatric History: Reviewed with  Family History:  Family History  Problem  Relation Age of Onset  . Hypertension Unknown   . Uterine cancer Unknown        Great MGM  . Stomach cancer Maternal Grandmother     Social History:  Social History   Social History  . Marital status: Married    Spouse name: N/A  . Number of children: N/A  . Years of education: N/A   Social History Main Topics  . Smoking status: Never Smoker  . Smokeless tobacco: Never Used  . Alcohol use No  . Drug use: No  . Sexual activity: Not Asked   Other Topics Concern  . None   Social History Narrative  . None    Allergies:  Allergies  Allergen Reactions  . Azithromycin   . Codeine Other (See Comments)    miagraines  . Erythromycin Other (See Comments)    Major yeast infection  . Gluten Meal Other (See Comments)    migraines    Metabolic Disorder Labs: No results found for: HGBA1C, MPG No results found for: PROLACTIN Lab Results  Component Value Date   CHOL 121 04/06/2016   TRIG 36 04/06/2016   HDL 62 04/06/2016   CHOLHDL 2.0 04/06/2016   VLDL 7 04/06/2016   LDLCALC 52 04/06/2016   No results found for: TSH  Therapeutic Level Labs: No results found for: LITHIUM No results found for: VALPROATE No components found for:  CBMZ  Current Medications: Current Outpatient Prescriptions  Medication Sig Dispense Refill  . caffeine 200 MG TABS tablet Take 200 mg by mouth daily as needed.    . Cholecalciferol (VITAMIN D3) 5000 units TABS Take 1 tablet by mouth every Monday, Wednesday, and Friday.    Marland Kitchen dextroamphetamine (DEXEDRINE SPANSULE) 15 MG 24 hr capsule Take 2 in AM 60 capsule 0  . FROVA 2.5 MG tablet Take 2.5 mg by mouth daily as needed for migraine.   0  . lamoTRIgine (LAMICTAL) 100 MG tablet Take 1 tablet (100 mg total) by mouth 2 (two) times daily. 180 tablet 0  . Multiple Vitamin (MULTIVITAMIN WITH MINERALS) TABS tablet Take 1 tablet by mouth daily.    Marland Kitchen PROAIR HFA 108 (90 BASE) MCG/ACT inhaler Inhale 1 puff into the lungs every 4 (four) hours as needed for  wheezing.   0  . busPIRone (BUSPAR) 5 MG tablet Take 1 tablet (5 mg total) by mouth 2 (two) times daily. (Patient not taking: Reported on 09/09/2017) 60 tablet 2  . hydrOXYzine (VISTARIL) 25 MG capsule Take 1-2 capsule at bed time for anxiety (Patient not taking: Reported on 09/09/2017) 45 capsule 1   No current facility-administered medications for this visit.      Musculoskeletal: Strength & Muscle Tone: within normal limits Gait & Station: normal Patient leans: N/A  Psychiatric Specialty Exam: ROS  Blood pressure 120/70, pulse 81, height 5\' 3"  (1.6 m), weight 144 lb 12.8 oz (65.7 kg), last menstrual  period 02/15/2014.Body mass index is 25.65 kg/m.  General Appearance: Casual  Eye Contact:  Good  Speech:  Clear and Coherent  Volume:  Normal  Mood:  Anxious and Dysphoric  Affect:  Constricted  Thought Process:  Goal Directed  Orientation:  Full (Time, Place, and Person)  Thought Content: Logical   Suicidal Thoughts:  No  Homicidal Thoughts:  No  Memory:  Immediate;   Good Recent;   Good Remote;   Good  Judgement:  Good  Insight:  Good  Psychomotor Activity:  Normal  Concentration:  Concentration: Good and Attention Span: Good  Recall:  Good  Fund of Knowledge: Good  Language: Good  Akathisia:  No  Handed:  Right  AIMS (if indicated): not done  Assets:  Communication Skills Desire for Improvement Housing Talents/Skills  ADL's:  Intact  Cognition: WNL  Sleep:  Good   Screenings:   Assessment and Plan: Depressive disorder, recurrent.  Attention deficit disorder, inattentive type.  Anxiety disorder NOS.  Reassurance given.  I will discontinue BuSpar because she never picked up the prescription and she is not taking it.  Recommended to try Lamictal 150 mg twice a day to help the depression.  I also recommended to take Vistaril up to 3 times a day for severe anxiety and nervousness.  Patient is tolerating her medication without any side effects.  The patient has seen  therapist in [place] after first attack by pit bull.  I recommended to restart counseling with therapist Kandis Cocking she used to see her in the past.  We discussed that phobia and anxiety may get help if you do CBT.  Patient will start counseling.  I recommended to call us back if she has any question or any concern.  Discussed stimulant abuse, withdrawal and tolerance.  Reminded that if she developed a rash with the Lamictal that she need to stop the medication immediately.  Follow-up in 3 months.    Miraya Cudney T., MD 09/09/2017, 10:31 AM

## 2017-12-10 ENCOUNTER — Ambulatory Visit (INDEPENDENT_AMBULATORY_CARE_PROVIDER_SITE_OTHER): Payer: BLUE CROSS/BLUE SHIELD | Admitting: Psychiatry

## 2017-12-10 ENCOUNTER — Encounter (HOSPITAL_COMMUNITY): Payer: Self-pay | Admitting: Psychiatry

## 2017-12-10 DIAGNOSIS — F330 Major depressive disorder, recurrent, mild: Secondary | ICD-10-CM | POA: Diagnosis not present

## 2017-12-10 DIAGNOSIS — F900 Attention-deficit hyperactivity disorder, predominantly inattentive type: Secondary | ICD-10-CM

## 2017-12-10 DIAGNOSIS — Z79899 Other long term (current) drug therapy: Secondary | ICD-10-CM

## 2017-12-10 MED ORDER — LAMOTRIGINE 150 MG PO TABS: 150.0000 mg | ORAL_TABLET | Freq: Every day | ORAL | 0 refills | Status: DC

## 2017-12-10 MED ORDER — DEXTROAMPHETAMINE SULFATE ER 15 MG PO CP24: ORAL_CAPSULE | ORAL | 0 refills | Status: DC

## 2017-12-10 MED ORDER — LAMOTRIGINE 100 MG PO TABS: 100.0000 mg | ORAL_TABLET | Freq: Every day | ORAL | 0 refills | Status: DC

## 2017-12-10 NOTE — Progress Notes (Signed)
[facility] MD/PA/NP OP Progress Note  12/10/2017 10:08 AM Courtney Lee  MRN:  [id number]  Chief Complaint: I am doing good.  I am only taking Lamictal 250 mg.  My anxiety is much better.  I am no longer taking Vistaril.  HPI: Patient came for her follow-up appointment.  She is doing much better since Lamictal dose increase.  However she is not taking 300 because she felt dizzy.  She is taking Lamictal 250 mg.  She has no rash, itching, tremors or shakes.  She also denies any major panic attack.  She does not feel she needed to see a therapist.  She does not takes Vistaril.  Her sleep is good.  She had a good Christmas.  She went to see her son who lives in [place].  She is also planning to visit [place] next week.  Her energy level is good.  She is more social active and started fitness club.  Patient denies drinking alcohol or using any illegal substances.  Her attention, concentration is good.  She is able to do multitasking.  She has no tremors or any concerns from the medication.  Her sleep is good.  Visit Diagnosis:    ICD-10-CM   1. Attention deficit hyperactivity disorder (ADHD), predominantly inattentive type F90.0 dextroamphetamine (DEXEDRINE SPANSULE) 15 MG 24 hr capsule    lamoTRIgine (LAMICTAL) 100 MG tablet    DISCONTINUED: dextroamphetamine (DEXEDRINE SPANSULE) 15 MG 24 hr capsule    DISCONTINUED: dextroamphetamine (DEXEDRINE SPANSULE) 15 MG 24 hr capsule    DISCONTINUED: lamoTRIgine (LAMICTAL) 150 MG tablet    Past Psychiatric History: Viewed. Patient has history of depression, anxiety and ADD. In the past she has taken Prozac, Effexor with limited response. She also took BuSpar and Vistaril to help anxiety.  Patient denies any history of psychiatric inpatient treatment, psychosis, hallucination or any suicidal attempt.  Past Medical History:  Past Medical History:  Diagnosis Date  . Abnormal Pap smear early 20's  . Asthma   . Depression   . Headache(784.0)     miagraine  . PONV (postoperative nausea and vomiting)     Past Surgical History:  Procedure Laterality Date  . CARPAL TUNNEL RELEASE  2003   right hand  . CESAREAN SECTION     x 3  . COLONOSCOPY WITH PROPOFOL N/A 06/01/2016   Procedure: COLONOSCOPY WITH PROPOFOL;  Surgeon: Garlan Fair, MD;  Location: [facility];  Service: Endoscopy;  Laterality: N/A;  . ESOPHAGOGASTRODUODENOSCOPY (EGD) WITH PROPOFOL N/A 06/01/2016   Procedure: ESOPHAGOGASTRODUODENOSCOPY (EGD) WITH PROPOFOL;  Surgeon: Garlan Fair, MD;  Location: [facility];  Service: Endoscopy;  Laterality: N/A;  . GYNECOLOGIC CRYOSURGERY    . LEEP    . TUBAL LIGATION    . WISDOM TOOTH EXTRACTION      Family Psychiatric History: Reviewed.  Family History:  Family History  Problem Relation Age of Onset  . Hypertension Unknown   . Uterine cancer Unknown        Great MGM  . Stomach cancer Maternal Grandmother     Social History:  Social History   Socioeconomic History  . Marital status: Married    Spouse name: Not on file  . Number of children: Not on file  . Years of education: Not on file  . Highest education level: Not on file  Social Needs  . Financial resource strain: Not on file  . Food insecurity - worry: Not on file  . Food insecurity - inability: Not on file  .  Transportation needs - medical: Not on file  . Transportation needs - non-medical: Not on file  Occupational History  . Not on file  Tobacco Use  . Smoking status: Never Smoker  . Smokeless tobacco: Never Used  Substance and Sexual Activity  . Alcohol use: No    Alcohol/week: 0.0 oz  . Drug use: No  . Sexual activity: Not on file  Other Topics Concern  . Not on file  Social History Narrative  . Not on file    Allergies:  Allergies  Allergen Reactions  . Azithromycin   . Codeine Other (See Comments)    miagraines  . Erythromycin Other (See Comments)    Major yeast infection  . Gluten Meal Other (See Comments)    migraines     Metabolic Disorder Labs: No results found for: HGBA1C, MPG No results found for: PROLACTIN Lab Results  Component Value Date   CHOL 121 04/06/2016   TRIG 36 04/06/2016   HDL 62 04/06/2016   CHOLHDL 2.0 04/06/2016   VLDL 7 04/06/2016   LDLCALC 52 04/06/2016   No results found for: TSH  Therapeutic Level Labs: No results found for: LITHIUM No results found for: VALPROATE No components found for:  CBMZ  Current Medications: Current Outpatient Medications  Medication Sig Dispense Refill  . caffeine 200 MG TABS tablet Take 200 mg by mouth daily as needed.    . Cholecalciferol (VITAMIN D3) 5000 units TABS Take 1 tablet by mouth every Monday, Wednesday, and Friday.    Marland Kitchen dextroamphetamine (DEXEDRINE SPANSULE) 15 MG 24 hr capsule Take 2 in AM 60 capsule 0  . FROVA 2.5 MG tablet Take 2.5 mg by mouth daily as needed for migraine.   0  . hydrOXYzine (VISTARIL) 25 MG capsule Take 1-3 capsule at bed time for anxiety 90 capsule 1  . lamoTRIgine (LAMICTAL) 150 MG tablet Take 2 tablets (300 mg total) by mouth daily. 180 tablet 0  . Multiple Vitamin (MULTIVITAMIN WITH MINERALS) TABS tablet Take 1 tablet by mouth daily.    Marland Kitchen PROAIR HFA 108 (90 BASE) MCG/ACT inhaler Inhale 1 puff into the lungs every 4 (four) hours as needed for wheezing.   0   No current facility-administered medications for this visit.      Musculoskeletal: Strength & Muscle Tone: within normal limits Gait & Station: normal Patient leans: N/A  Psychiatric Specialty Exam: ROS  Blood pressure 118/78, pulse 80, height 5\' 3"  (1.6 m), weight 144 lb (65.3 kg), last menstrual period 02/15/2014.There is no height or weight on file to calculate BMI.  General Appearance: Well Groomed  Eye Contact:  Good  Speech:  Clear and Coherent  Volume:  Normal  Mood:  Euthymic  Affect:  Congruent  Thought Process:  Goal Directed  Orientation:  Full (Time, Place, and Person)  Thought Content: Logical   Suicidal Thoughts:  No   Homicidal Thoughts:  No  Memory:  Immediate;   Good Recent;   Good Remote;   Good  Judgement:  Good  Insight:  Good  Psychomotor Activity:  Normal  Concentration:  Concentration: Good and Attention Span: Good  Recall:  Good  Fund of Knowledge: Good  Language: Good  Akathisia:  No  Handed:  Right  AIMS (if indicated): not done  Assets:  Communication Skills Desire for Improvement Housing Leisure Time [place] Talents/Skills Transportation  ADL's:  Intact  Cognition: WNL  Sleep:  Good   Screenings:   Assessment and Plan: Major depressive  disorder, recurrent.  Attention deficit disorder, inattentive type.  Patient doing better on her current medication.  She is no longer taking Vistaril.  She did like to increase Lamictal but taking only 250 mg.  She is not seeing therapist because she felt she does not need it anymore.  I will continue Lamictal 250 mg daily.  Continue Dexedrine 15 mg 2 tablet in the morning.  Discussed medication side effects and benefits.  Recommended to call us back if he has any question or any concern.  Follow-up in 3 months.  I will discontinue Vistaril.   Kathlee Nations, MD 12/10/2017, 10:08 AM

## 2018-02-07 ENCOUNTER — Other Ambulatory Visit: Payer: Self-pay | Admitting: Internal Medicine

## 2018-02-07 ENCOUNTER — Ambulatory Visit
Admission: RE | Admit: 2018-02-07 | Discharge: 2018-02-07 | Disposition: A | Discharge status: Home / Self Care | Payer: BLUE CROSS/BLUE SHIELD | Source: Ambulatory Visit | Attending: Internal Medicine | Admitting: Internal Medicine

## 2018-02-07 DIAGNOSIS — M7918 Myalgia, other site: Secondary | ICD-10-CM

## 2018-03-09 ENCOUNTER — Ambulatory Visit (HOSPITAL_COMMUNITY): Payer: BLUE CROSS/BLUE SHIELD | Admitting: Psychiatry

## 2018-03-14 ENCOUNTER — Encounter (HOSPITAL_COMMUNITY): Payer: Self-pay | Admitting: Psychiatry

## 2018-03-14 ENCOUNTER — Ambulatory Visit (HOSPITAL_COMMUNITY): Payer: BLUE CROSS/BLUE SHIELD | Admitting: Psychiatry

## 2018-03-14 VITALS — BP 121/78 | HR 78 | Ht 63.0 in | Wt 144.4 lb

## 2018-03-14 DIAGNOSIS — F330 Major depressive disorder, recurrent, mild: Secondary | ICD-10-CM

## 2018-03-14 DIAGNOSIS — F900 Attention-deficit hyperactivity disorder, predominantly inattentive type: Secondary | ICD-10-CM

## 2018-03-14 DIAGNOSIS — E559 Vitamin D deficiency, unspecified: Secondary | ICD-10-CM | POA: Insufficient documentation

## 2018-03-14 MED ORDER — DEXTROAMPHETAMINE SULFATE ER 15 MG PO CP24: ORAL_CAPSULE | ORAL | 0 refills | Status: DC

## 2018-03-14 MED ORDER — TRAZODONE HCL 50 MG PO TABS: 50.0000 mg | ORAL_TABLET | Freq: Every evening | ORAL | 0 refills | Status: DC | PRN

## 2018-03-14 MED ORDER — LAMOTRIGINE 150 MG PO TABS: 300.0000 mg | ORAL_TABLET | Freq: Every day | ORAL | 0 refills | Status: DC

## 2018-03-14 NOTE — Progress Notes (Signed)
[facility] MD/PA/NP OP Progress Note  03/14/2018 1:10 PM Courtney Lee  MRN:  [id number]  Chief Complaint: I have noticed depression in recent weeks and I increased my Lamictal.  I am feeling better now.  There are days I do not sleep well.  HPI: Patient came for her follow-up appointment.  The past few weeks she is taking Lamictal 350 mg.  In the past she had tried higher dose but felt dizzy but now she has no side effects.  She is feeling much better.  She denies any feeling of hopelessness or worthlessness.  There are days that she has poor sleep and racing thoughts but denies any suicidal thoughts or homicidal thought.  She endorsed increased stress because of frequent traveling to [place].  Most of her family member lives in [place] and she travels very frequently.  She noticed more active since increase the Lamictal dose.  She denies drinking or using any illegal substances.  Her attention consultation is good.  She is able to do multitasking.  She has no side effects from stimulant.  She is pleased that her daughter who is also seeing this Probation officer in the office doing much better.  Visit Diagnosis:    ICD-10-CM   1. MDD (major depressive disorder), recurrent episode, mild (HCC) F33.0 lamoTRIgine (LAMICTAL) 150 MG tablet    traZODone (DESYREL) 50 MG tablet  2. Attention deficit hyperactivity disorder (ADHD), predominantly inattentive type F90.0 dextroamphetamine (DEXEDRINE SPANSULE) 15 MG 24 hr capsule    DISCONTINUED: dextroamphetamine (DEXEDRINE SPANSULE) 15 MG 24 hr capsule    DISCONTINUED: dextroamphetamine (DEXEDRINE SPANSULE) 15 MG 24 hr capsule    Past Psychiatric History: Reviewed Patient has history of depression, anxiety and ADD. In the past she has taken Prozac, Effexor with limited response. She also took BuSpar and Vistaril to help anxiety.  Patient denies any history of psychiatric inpatient treatment, psychosis, hallucination or any suicidal attempt.  Past Medical  History:  Past Medical History:  Diagnosis Date  . Abnormal Pap smear early 20's  . Asthma   . Depression   . Headache(784.0)    miagraine  . PONV (postoperative nausea and vomiting)     Past Surgical History:  Procedure Laterality Date  . CARPAL TUNNEL RELEASE  2003   right hand  . CESAREAN SECTION     x 3  . COLONOSCOPY WITH PROPOFOL N/A 06/01/2016   Procedure: COLONOSCOPY WITH PROPOFOL;  Surgeon: Garlan Fair, MD;  Location: [facility];  Service: Endoscopy;  Laterality: N/A;  . ESOPHAGOGASTRODUODENOSCOPY (EGD) WITH PROPOFOL N/A 06/01/2016   Procedure: ESOPHAGOGASTRODUODENOSCOPY (EGD) WITH PROPOFOL;  Surgeon: Garlan Fair, MD;  Location: [facility];  Service: Endoscopy;  Laterality: N/A;  . GYNECOLOGIC CRYOSURGERY    . LEEP    . TUBAL LIGATION    . WISDOM TOOTH EXTRACTION      Family Psychiatric History: Reviewed  Family History:  Family History  Problem Relation Age of Onset  . Hypertension Unknown   . Uterine cancer Unknown        Great MGM  . Stomach cancer Maternal Grandmother     Social History:  Social History   Socioeconomic History  . Marital status: Married    Spouse name: Not on file  . Number of children: Not on file  . Years of education: Not on file  . Highest education level: Not on file  Occupational History  . Not on file  Social Needs  . Financial resource strain: Not on file  .  Food insecurity:    Worry: Not on file    Inability: Not on file  . Transportation needs:    Medical: Not on file    Non-medical: Not on file  Tobacco Use  . Smoking status: Never Smoker  . Smokeless tobacco: Never Used  Substance and Sexual Activity  . Alcohol use: No    Alcohol/week: 0.0 oz  . Drug use: No  . Sexual activity: Not on file  Lifestyle  . Physical activity:    Days per week: Not on file    Minutes per session: Not on file  . Stress: Not on file  Relationships  . Social connections:    Talks on phone: Not on file    Gets  together: Not on file    Attends religious service: Not on file    Active member of club or organization: Not on file    Attends meetings of clubs or organizations: Not on file    Relationship status: Not on file  Other Topics Concern  . Not on file  Social History Narrative  . Not on file    Allergies:  Allergies  Allergen Reactions  . Azithromycin   . Codeine Other (See Comments)    miagraines  . Erythromycin Other (See Comments)    Major yeast infection  . Gluten Meal Other (See Comments)    migraines    Metabolic Disorder Labs: No results found for: HGBA1C, MPG No results found for: PROLACTIN Lab Results  Component Value Date   CHOL 121 04/06/2016   TRIG 36 04/06/2016   HDL 62 04/06/2016   CHOLHDL 2.0 04/06/2016   VLDL 7 04/06/2016   LDLCALC 52 04/06/2016   No results found for: TSH  Therapeutic Level Labs: No results found for: LITHIUM No results found for: VALPROATE No components found for:  CBMZ  Current Medications: Current Outpatient Medications  Medication Sig Dispense Refill  . caffeine 200 MG TABS tablet Take 200 mg by mouth daily as needed.    . Cholecalciferol (VITAMIN D3) 5000 units TABS Take 1 tablet by mouth every Monday, Wednesday, and Friday.    Marland Kitchen dextroamphetamine (DEXEDRINE SPANSULE) 15 MG 24 hr capsule Take 2 in AM 60 capsule 0  . FROVA 2.5 MG tablet Take 2.5 mg by mouth daily as needed for migraine.   0  . lamoTRIgine (LAMICTAL) 100 MG tablet Take 1 tablet (100 mg total) by mouth daily. 90 tablet 0  . Multiple Vitamin (MULTIVITAMIN WITH MINERALS) TABS tablet Take 1 tablet by mouth daily.    Marland Kitchen PROAIR HFA 108 (90 BASE) MCG/ACT inhaler Inhale 1 puff into the lungs every 4 (four) hours as needed for wheezing.   0   No current facility-administered medications for this visit.      Musculoskeletal: Strength & Muscle Tone: within normal limits Gait & Station: normal Patient leans: N/A  Psychiatric Specialty Exam: ROS  Blood pressure  121/78, pulse 78, height 5\' 3"  (1.6 m), weight 144 lb 6.4 oz (65.5 kg), last menstrual period 02/15/2014, SpO2 97 %.There is no height or weight on file to calculate BMI.  General Appearance: Casual  Eye Contact:  Good  Speech:  Clear and Coherent  Volume:  Normal  Mood:  Anxious  Affect:  Appropriate  Thought Process:  Goal Directed  Orientation:  Full (Time, Place, and Person)  Thought Content: Logical   Suicidal Thoughts:  No  Homicidal Thoughts:  No  Memory:  Immediate;   Good Recent;   Good Remote;  Good  Judgement:  Good  Insight:  Good  Psychomotor Activity:  Normal  Concentration:  Concentration: Good and Attention Span: Good  Recall:  Good  Fund of Knowledge: Good  Language: Good  Akathisia:  No  Handed:  Right  AIMS (if indicated): not done  Assets:  Communication Skills Desire for Improvement Housing Resilience Social Support  ADL's:  Intact  Cognition: WNL  Sleep:  Fair   Screenings:   Assessment and Plan: Attention deficit disorder, inattentive type.  Major depressive disorder, recurrent.  Discussed medication side effect especially higher dose of Lamictal can cause rash and itching.  Recommended to try Lamictal 300 instead of 350.  Continue Dexedrine 50 mg 2 tablet in the morning.  We will try low-dose trazodone to help insomnia.  Patient is not interested in counseling.  Recommended to call us back if she has any question, concern if you feel worsening of the symptoms.  Follow-up in 3 months.   Kathlee Nations, MD 03/14/2018, 1:10 PM

## 2018-06-14 ENCOUNTER — Ambulatory Visit (HOSPITAL_COMMUNITY): Payer: Self-pay | Admitting: Psychiatry

## 2018-06-17 ENCOUNTER — Telehealth (HOSPITAL_COMMUNITY): Payer: Self-pay

## 2018-06-17 DIAGNOSIS — F900 Attention-deficit hyperactivity disorder, predominantly inattentive type: Secondary | ICD-10-CM

## 2018-06-17 DIAGNOSIS — F330 Major depressive disorder, recurrent, mild: Secondary | ICD-10-CM

## 2018-06-17 MED ORDER — TRAZODONE HCL 50 MG PO TABS: 50.0000 mg | ORAL_TABLET | Freq: Every evening | ORAL | 0 refills | Status: DC | PRN

## 2018-06-17 MED ORDER — LAMOTRIGINE 150 MG PO TABS: 300.0000 mg | ORAL_TABLET | Freq: Every day | ORAL | 0 refills | Status: DC

## 2018-06-17 MED ORDER — DEXTROAMPHETAMINE SULFATE ER 15 MG PO CP24: ORAL_CAPSULE | ORAL | 0 refills | Status: DC

## 2018-06-17 NOTE — Telephone Encounter (Signed)
All set, refills sent

## 2018-06-17 NOTE — Telephone Encounter (Signed)
Arfeen patient - she was rescheduled and needs a refill on medications, one of them is dextroamphetamine. Patient uses [facility] in [place] - please review and advise, thank you

## 2018-06-20 ENCOUNTER — Telehealth (HOSPITAL_COMMUNITY): Payer: Self-pay

## 2018-06-20 NOTE — Telephone Encounter (Signed)
Patient is calling because she wants the medication that was sent into the pharmacy to be discontinued. Patient is leaving to go out of town and wants paper prescriptions for all of her medications, she is requesting a 90 day supply. Please review and advise, thank you

## 2018-06-22 NOTE — Telephone Encounter (Signed)
I called patient and she made an appointment to come in tomorrow at 1 pm

## 2018-06-22 NOTE — Telephone Encounter (Signed)
I have never met her and not comfortable to send in 90 days prescription of stimulant

## 2018-06-23 ENCOUNTER — Ambulatory Visit (HOSPITAL_COMMUNITY): Payer: BLUE CROSS/BLUE SHIELD | Admitting: Psychiatry

## 2018-06-23 ENCOUNTER — Encounter (HOSPITAL_COMMUNITY): Payer: Self-pay | Admitting: Psychiatry

## 2018-06-23 DIAGNOSIS — F900 Attention-deficit hyperactivity disorder, predominantly inattentive type: Secondary | ICD-10-CM

## 2018-06-23 DIAGNOSIS — F330 Major depressive disorder, recurrent, mild: Secondary | ICD-10-CM

## 2018-06-23 MED ORDER — LAMOTRIGINE 150 MG PO TABS: 300.0000 mg | ORAL_TABLET | Freq: Every day | ORAL | 0 refills | Status: DC

## 2018-06-23 MED ORDER — DEXTROAMPHETAMINE SULFATE ER 15 MG PO CP24: ORAL_CAPSULE | ORAL | 0 refills | Status: DC

## 2018-06-23 MED ORDER — DEXTROAMPHETAMINE SULFATE ER 15 MG PO CP24: 30.0000 mg | ORAL_CAPSULE | Freq: Every day | ORAL | 0 refills | Status: DC

## 2018-06-23 NOTE — Progress Notes (Signed)
[facility] MD/PA/NP OP Progress Note  06/23/2018 1:22 PM Courtney Lee  MRN:  [id number]  Chief Complaint: med management HPI: Courtney Lee reports stable mood, anxiety, and attention on the current dose of lamictal and dexedrine.  Denies any acute safety concerns. No side effects from dexedrine - no headaches, nausea, no history of seizures.  Is [place] for the next several weeks and wishes to get paper prescriptions for refills.  No other acute concerns and will follow up with Dr. Adele Schilder in November.   Visit Diagnosis:    ICD-10-CM   1. MDD (major depressive disorder), recurrent episode, mild (HCC) F33.0 lamoTRIgine (LAMICTAL) 150 MG tablet    dextroamphetamine (DEXEDRINE SPANSULE) 15 MG 24 hr capsule  2. Attention deficit hyperactivity disorder (ADHD), predominantly inattentive type F90.0 dextroamphetamine (DEXEDRINE SPANSULE) 15 MG 24 hr capsule    dextroamphetamine (DEXEDRINE) 15 MG 24 hr capsule    dextroamphetamine (DEXEDRINE SPANSULE) 15 MG 24 hr capsule    Past Psychiatric History: See intake H&P for full details. Reviewed, with no updates at this time.   Past Medical History:  Past Medical History:  Diagnosis Date  . Abnormal Pap smear early 20's  . Asthma   . Depression   . Headache(784.0)    miagraine  . PONV (postoperative nausea and vomiting)     Past Surgical History:  Procedure Laterality Date  . CARPAL TUNNEL RELEASE  2003   right hand  . CESAREAN SECTION     x 3  . COLONOSCOPY WITH PROPOFOL N/A 06/01/2016   Procedure: COLONOSCOPY WITH PROPOFOL;  Surgeon: Garlan Fair, MD;  Location: [facility];  Service: Endoscopy;  Laterality: N/A;  . ESOPHAGOGASTRODUODENOSCOPY (EGD) WITH PROPOFOL N/A 06/01/2016   Procedure: ESOPHAGOGASTRODUODENOSCOPY (EGD) WITH PROPOFOL;  Surgeon: Garlan Fair, MD;  Location: [facility];  Service: Endoscopy;  Laterality: N/A;  . GYNECOLOGIC CRYOSURGERY    . LEEP    . TUBAL LIGATION    . WISDOM TOOTH  EXTRACTION      Family Psychiatric History: See intake H&P for full details. Reviewed, with no updates at this time.   Family History:  Family History  Problem Relation Age of Onset  . Hypertension Unknown   . Uterine cancer Unknown        Great MGM  . Stomach cancer Maternal Grandmother     Social History:  Social History   Socioeconomic History  . Marital status: Married    Spouse name: Not on file  . Number of children: Not on file  . Years of education: Not on file  . Highest education level: Not on file  Occupational History  . Not on file  Social Needs  . Financial resource strain: Not on file  . Food insecurity:    Worry: Not on file    Inability: Not on file  . Transportation needs:    Medical: Not on file    Non-medical: Not on file  Tobacco Use  . Smoking status: Never Smoker  . Smokeless tobacco: Never Used  Substance and Sexual Activity  . Alcohol use: No    Alcohol/week: 0.0 standard drinks  . Drug use: No  . Sexual activity: Not on file  Lifestyle  . Physical activity:    Days per week: Not on file    Minutes per session: Not on file  . Stress: Not on file  Relationships  . Social connections:    Talks on phone: Not on file    Gets together: Not on file  Attends religious service: Not on file    Active member of club or organization: Not on file    Attends meetings of clubs or organizations: Not on file    Relationship status: Not on file  Other Topics Concern  . Not on file  Social History Narrative  . Not on file    Allergies:  Allergies  Allergen Reactions  . Azithromycin   . Codeine Other (See Comments)    miagraines  . Erythromycin Other (See Comments)    Major yeast infection  . Gluten Meal Other (See Comments)    migraines    Metabolic Disorder Labs: No results found for: HGBA1C, MPG No results found for: PROLACTIN Lab Results  Component Value Date   CHOL 121 04/06/2016   TRIG 36 04/06/2016   HDL 62 04/06/2016    CHOLHDL 2.0 04/06/2016   VLDL 7 04/06/2016   LDLCALC 52 04/06/2016   No results found for: TSH  Therapeutic Level Labs: No results found for: LITHIUM No results found for: VALPROATE No components found for:  CBMZ  Current Medications: Current Outpatient Medications  Medication Sig Dispense Refill  . caffeine 200 MG TABS tablet Take 200 mg by mouth daily as needed.    . Cholecalciferol (VITAMIN D3) 5000 units TABS Take 1 tablet by mouth every Monday, Wednesday, and Friday.    Marland Kitchen dextroamphetamine (DEXEDRINE SPANSULE) 15 MG 24 hr capsule Take 2 in AM 60 capsule 0  . [START ON 08/22/2018] dextroamphetamine (DEXEDRINE SPANSULE) 15 MG 24 hr capsule Take 2 capsules (30 mg total) by mouth daily. 60 capsule 0  . [START ON 07/23/2018] dextroamphetamine (DEXEDRINE) 15 MG 24 hr capsule Take 2 capsules (30 mg total) by mouth daily. 60 capsule 0  . FROVA 2.5 MG tablet Take 2.5 mg by mouth daily as needed for migraine.   0  . lamoTRIgine (LAMICTAL) 150 MG tablet Take 2 tablets (300 mg total) by mouth daily. 180 tablet 0  . Multiple Vitamin (MULTIVITAMIN WITH MINERALS) TABS tablet Take 1 tablet by mouth daily.    Marland Kitchen PROAIR HFA 108 (90 BASE) MCG/ACT inhaler Inhale 1 puff into the lungs every 4 (four) hours as needed for wheezing.   0   No current facility-administered medications for this visit.      Musculoskeletal: Strength & Muscle Tone: within normal limits Gait & Station: normal Patient leans: N/A  Psychiatric Specialty Exam: ROS  Last menstrual period 02/15/2014.There is no height or weight on file to calculate BMI.  General Appearance: Casual and Well Groomed  Eye Contact:  Good  Speech:  Clear and Coherent and Normal Rate  Volume:  Normal  Mood:  Euthymic  Affect:  Congruent  Thought Process:  Goal Directed and Descriptions of Associations: Intact  Orientation:  Full (Time, Place, and Person)  Thought Content: Logical   Suicidal Thoughts:  No  Homicidal Thoughts:  No  Memory:   Immediate;   Good  Judgement:  Good  Insight:  Good  Psychomotor Activity:  Normal  Concentration:  Concentration: Good  Recall:  Good  Fund of Knowledge: Good  Language: Good  Akathisia:  Negative  Handed:  Right  AIMS (if indicated): not done  Assets:  Communication Skills Desire for Improvement Financial Resources/Insurance Housing Intimacy Leisure Time Physical Health Resilience Social Support Talents/Skills Transportation Vocational/Educational  ADL's:  Intact  Cognition: WNL  Sleep:  Good with benadryl   Screenings:   Assessment and Plan:  Burnetta Herold-[name] presents with stable mood, adhd. NCCSD reviewed and  appropriate.  [place] to visit mom.  Stable marriage and continues to enjoy gardenin.  3 months refills provided.   1. MDD (major depressive disorder), recurrent episode, mild ([place])   2. Attention deficit hyperactivity disorder (ADHD), predominantly inattentive type     Status of current problems: stable  Labs Ordered: No orders of the defined types were placed in this encounter.   Labs Reviewed: n/a  Collateral Obtained/Records Reviewed: Dr. Adele Schilder notes, NCCSD reviewed  Plan:  Lamictal 150 mg BID Dexedrine spansule 30 mg daily ; Three 30-days scripts provided rtc 3 months  Aundra Dubin, MD 06/23/2018, 1:22 PM

## 2018-07-05 ENCOUNTER — Ambulatory Visit (INDEPENDENT_AMBULATORY_CARE_PROVIDER_SITE_OTHER): Payer: Self-pay | Admitting: Orthopaedic Surgery

## 2018-07-20 ENCOUNTER — Ambulatory Visit (HOSPITAL_COMMUNITY): Payer: Self-pay | Admitting: Psychiatry

## 2018-09-12 ENCOUNTER — Ambulatory Visit (HOSPITAL_COMMUNITY): Payer: Self-pay | Admitting: Psychiatry

## 2018-09-29 ENCOUNTER — Telehealth (HOSPITAL_COMMUNITY): Payer: Self-pay

## 2018-09-29 DIAGNOSIS — F900 Attention-deficit hyperactivity disorder, predominantly inattentive type: Secondary | ICD-10-CM

## 2018-09-29 DIAGNOSIS — F330 Major depressive disorder, recurrent, mild: Secondary | ICD-10-CM

## 2018-09-29 MED ORDER — LAMOTRIGINE 150 MG PO TABS
300.0000 mg | ORAL_TABLET | Freq: Every day | ORAL | 0 refills | Status: AC
Start: 2018-09-29 — End: ?

## 2018-09-29 MED ORDER — DEXTROAMPHETAMINE SULFATE ER 15 MG PO CP24
30.0000 mg | ORAL_CAPSULE | Freq: Every day | ORAL | 0 refills | Status: AC
Start: 2018-09-29 — End: ?

## 2018-09-29 NOTE — Telephone Encounter (Signed)
Patient is calling for refills, she is still in [place] and will not be back until February. Patient does not currently have an appointment and was last seen in August by Dr. Daron Offer. She is asking for paper prescriptions to be mailed to her. Please review and advise, thank you

## 2018-09-29 NOTE — Telephone Encounter (Signed)
Dr. Adele Schilder approved one month of each prescription and this will be mailed to the patient

## 2018-09-29 NOTE — Telephone Encounter (Signed)
Please provide prescription for one-time.  She must see physician in [place] if she is living there.

## 2018-10-29 IMAGING — DX DG PELVIS 1-2V
1 series · 1 of 1 positions shown · non-contrast
Comparison: CT abdomen and pelvis 01/27/2013

CLINICAL DATA: Buttock pain. Right lateral pelvic and hip pain for
3-4 weeks.

EXAM:
PELVIS - 1-2 VIEW

[dg pelvis 1-2 views]
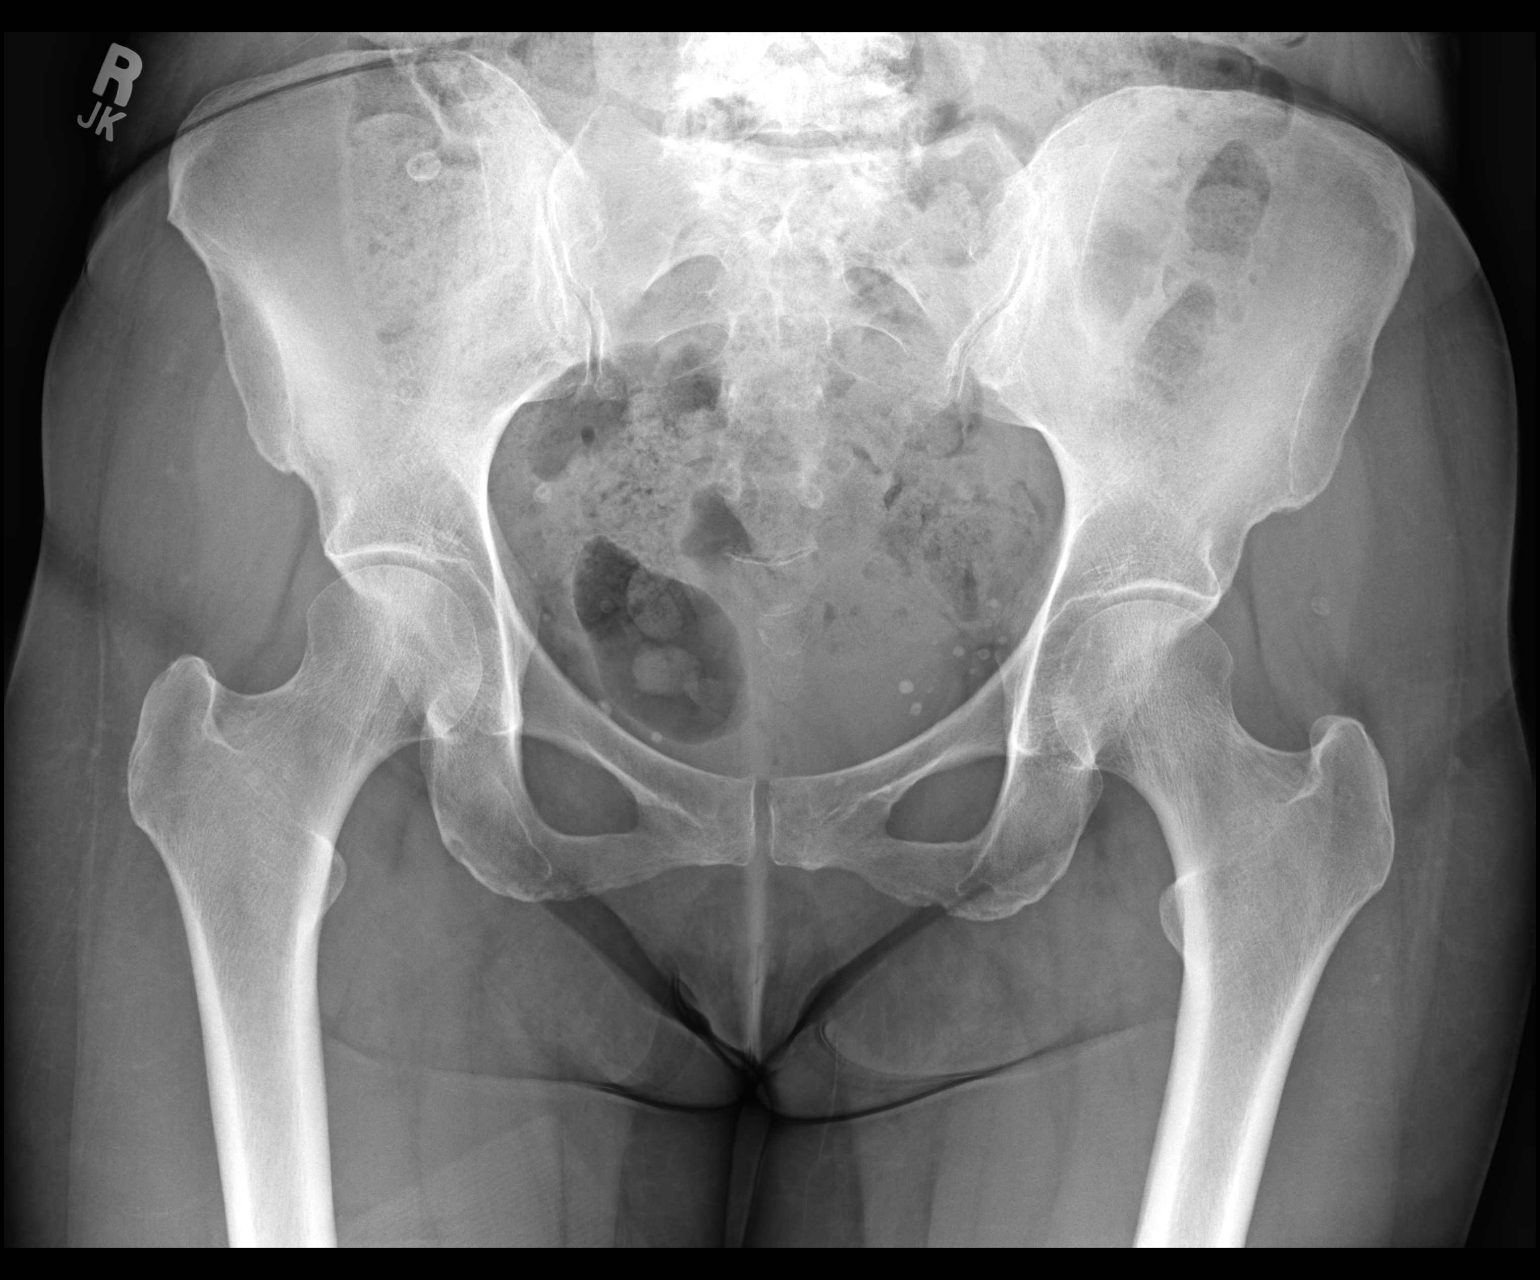

[1 of 1 positions shown; findings below may reference images not displayed]

FINDINGS: There is no evidence of pelvic fracture or diastasis. No significant
arthropathic changes are identified at the hips. The SI joints are
unremarkable. Small calcifications in the pelvis are compatible with
phleboliths.
IMPRESSION: Negative.
